# Patient Record
Sex: Female | Born: 1955 | Race: White | Hispanic: No | Marital: Married | Smoking: Current every day smoker
Health system: Southern US, Community
[De-identification: ages and names within clinical notes are randomized; demographics above are authoritative.]

## PROBLEM LIST (undated history)

## (undated) DIAGNOSIS — T7840XA Allergy, unspecified, initial encounter: Secondary | ICD-10-CM

## (undated) DIAGNOSIS — N200 Calculus of kidney: Secondary | ICD-10-CM

## (undated) HISTORY — DX: Allergy, unspecified, initial encounter: T78.40XA

## (undated) HISTORY — PX: TUBAL LIGATION: SHX77

---

## 1997-03-29 ENCOUNTER — Other Ambulatory Visit: Admission: RE | Admit: 1997-03-29 | Discharge: 1997-03-29 | Payer: Self-pay | Admitting: Obstetrics and Gynecology

## 1997-04-27 ENCOUNTER — Ambulatory Visit (HOSPITAL_COMMUNITY): Admission: RE | Admit: 1997-04-27 | Discharge: 1997-04-27 | Payer: Self-pay | Admitting: Obstetrics and Gynecology

## 1998-04-10 ENCOUNTER — Other Ambulatory Visit: Admission: RE | Admit: 1998-04-10 | Discharge: 1998-04-10 | Payer: Self-pay | Admitting: Obstetrics and Gynecology

## 1998-08-09 ENCOUNTER — Other Ambulatory Visit: Admission: RE | Admit: 1998-08-09 | Discharge: 1998-08-09 | Payer: Self-pay | Admitting: Obstetrics and Gynecology

## 1999-01-01 ENCOUNTER — Other Ambulatory Visit: Admission: RE | Admit: 1999-01-01 | Discharge: 1999-01-01 | Payer: Self-pay | Admitting: Obstetrics and Gynecology

## 1999-06-13 ENCOUNTER — Other Ambulatory Visit: Admission: RE | Admit: 1999-06-13 | Discharge: 1999-06-13 | Payer: Self-pay | Admitting: Obstetrics and Gynecology

## 2000-01-22 ENCOUNTER — Other Ambulatory Visit: Admission: RE | Admit: 2000-01-22 | Discharge: 2000-01-22 | Payer: Self-pay | Admitting: Obstetrics and Gynecology

## 2000-09-29 ENCOUNTER — Other Ambulatory Visit: Admission: RE | Admit: 2000-09-29 | Discharge: 2000-09-29 | Payer: Self-pay | Admitting: Obstetrics and Gynecology

## 2002-01-19 ENCOUNTER — Other Ambulatory Visit: Admission: RE | Admit: 2002-01-19 | Discharge: 2002-01-19 | Payer: Self-pay | Admitting: Obstetrics and Gynecology

## 2003-01-26 ENCOUNTER — Other Ambulatory Visit: Admission: RE | Admit: 2003-01-26 | Discharge: 2003-01-26 | Payer: Self-pay | Admitting: Obstetrics and Gynecology

## 2004-01-23 ENCOUNTER — Other Ambulatory Visit: Admission: RE | Admit: 2004-01-23 | Discharge: 2004-01-23 | Payer: Self-pay | Admitting: Obstetrics and Gynecology

## 2004-12-26 ENCOUNTER — Other Ambulatory Visit: Admission: RE | Admit: 2004-12-26 | Discharge: 2004-12-26 | Payer: Self-pay | Admitting: Obstetrics and Gynecology

## 2014-03-15 ENCOUNTER — Encounter (HOSPITAL_COMMUNITY): Payer: Self-pay | Admitting: Emergency Medicine

## 2014-03-15 ENCOUNTER — Emergency Department (HOSPITAL_COMMUNITY)
Admission: EM | Admit: 2014-03-15 | Discharge: 2014-03-15 | Disposition: A | Discharge status: Home / Self Care | Payer: Self-pay | Attending: Emergency Medicine | Admitting: Emergency Medicine

## 2014-03-15 ENCOUNTER — Emergency Department (HOSPITAL_COMMUNITY): Payer: Self-pay

## 2014-03-15 DIAGNOSIS — Z9889 Other specified postprocedural states: Secondary | ICD-10-CM | POA: Insufficient documentation

## 2014-03-15 DIAGNOSIS — N200 Calculus of kidney: Secondary | ICD-10-CM | POA: Insufficient documentation

## 2014-03-15 DIAGNOSIS — R109 Unspecified abdominal pain: Secondary | ICD-10-CM

## 2014-03-15 DIAGNOSIS — Z720 Tobacco use: Secondary | ICD-10-CM | POA: Insufficient documentation

## 2014-03-15 DIAGNOSIS — Z9851 Tubal ligation status: Secondary | ICD-10-CM | POA: Insufficient documentation

## 2014-03-15 HISTORY — DX: Calculus of kidney: N20.0

## 2014-03-15 LAB — CBC WITH DIFFERENTIAL/PLATELET
Basophils Absolute: 0 K/uL (ref 0.0–0.1)
Basophils Relative: 0 % (ref 0–1)
Eosinophils Absolute: 0 K/uL (ref 0.0–0.7)
Eosinophils Relative: 0 % (ref 0–5)
HCT: 43.5 % (ref 36.0–46.0)
Hemoglobin: 14.2 g/dL (ref 12.0–15.0)
Lymphocytes Relative: 11 % — ABNORMAL LOW (ref 12–46)
Lymphs Abs: 2.2 K/uL (ref 0.7–4.0)
MCH: 30 pg (ref 26.0–34.0)
MCHC: 32.6 g/dL (ref 30.0–36.0)
MCV: 91.8 fL (ref 78.0–100.0)
Monocytes Absolute: 1 K/uL (ref 0.1–1.0)
Monocytes Relative: 5 % (ref 3–12)
Neutro Abs: 17 K/uL — ABNORMAL HIGH (ref 1.7–7.7)
Neutrophils Relative %: 84 % — ABNORMAL HIGH (ref 43–77)
Platelets: 338 K/uL (ref 150–400)
RBC: 4.74 MIL/uL (ref 3.87–5.11)
RDW: 13 % (ref 11.5–15.5)
WBC: 20.2 K/uL — ABNORMAL HIGH (ref 4.0–10.5)

## 2014-03-15 LAB — COMPREHENSIVE METABOLIC PANEL
ALT: 16 U/L (ref 0–35)
ANION GAP: 9 (ref 5–15)
AST: 22 U/L (ref 0–37)
Albumin: 4.7 g/dL (ref 3.5–5.2)
Alkaline Phosphatase: 99 U/L (ref 39–117)
BILIRUBIN TOTAL: 0.6 mg/dL (ref 0.3–1.2)
BUN: 16 mg/dL (ref 6–23)
CALCIUM: 9.1 mg/dL (ref 8.4–10.5)
CO2: 24 mmol/L (ref 19–32)
CREATININE: 0.78 mg/dL (ref 0.50–1.10)
Chloride: 105 mmol/L (ref 96–112)
GFR calc Af Amer: >90 mL/min (ref >90)
GFR calc non Af Amer: >90 mL/min (ref >90)
Glucose, Bld: 147 mg/dL — ABNORMAL HIGH (ref 70–99)
Potassium: 3.3 mmol/L — ABNORMAL LOW (ref 3.5–5.1)
SODIUM: 138 mmol/L (ref 135–145)
TOTAL PROTEIN: 7.2 g/dL (ref 6.0–8.3)

## 2014-03-15 LAB — URINALYSIS, ROUTINE W REFLEX MICROSCOPIC
Bilirubin Urine: NEGATIVE
Glucose, UA: 100 mg/dL — AB
Ketones, ur: NEGATIVE mg/dL
Leukocytes, UA: NEGATIVE
Nitrite: NEGATIVE
Protein, ur: NEGATIVE mg/dL
Specific Gravity, Urine: 1.013 (ref 1.005–1.030)
Urobilinogen, UA: 0.2 mg/dL (ref 0.0–1.0)
pH: 7 (ref 5.0–8.0)

## 2014-03-15 LAB — URINE MICROSCOPIC-ADD ON

## 2014-03-15 MED ORDER — IBUPROFEN 600 MG PO TABS: 600.0000 mg | ORAL_TABLET | Freq: Four times a day (QID) | ORAL | Status: DC | PRN

## 2014-03-15 MED ORDER — ONDANSETRON HCL 4 MG/2ML IJ SOLN
4.0000 mg | Freq: Once | INTRAMUSCULAR | Status: AC
  Administered 2014-03-15: 4 mg via INTRAVENOUS
  Filled 2014-03-15: qty 2

## 2014-03-15 MED ORDER — ONDANSETRON 8 MG PO TBDP: 8.0000 mg | ORAL_TABLET | Freq: Three times a day (TID) | ORAL | Status: DC | PRN

## 2014-03-15 MED ORDER — MORPHINE SULFATE 4 MG/ML IJ SOLN
4.0000 mg | Freq: Once | INTRAMUSCULAR | Status: AC
Start: 2014-03-15 — End: 2014-03-15
  Administered 2014-03-15: 4 mg via INTRAVENOUS
  Filled 2014-03-15: qty 1

## 2014-03-15 MED ORDER — HYDROCODONE-ACETAMINOPHEN 5-325 MG PO TABS: 2.0000 | ORAL_TABLET | ORAL | Status: DC | PRN

## 2014-03-15 MED ORDER — KETOROLAC TROMETHAMINE 30 MG/ML IJ SOLN
30.0000 mg | Freq: Once | INTRAMUSCULAR | Status: AC
  Administered 2014-03-15: 30 mg via INTRAVENOUS
  Filled 2014-03-15: qty 1

## 2014-03-15 NOTE — Discharge Instructions (Signed)
You have a 4.2 mm stone that has passed into your bladder from your right kidney.  Drink plenty of fluids.  Take pain and nausea medications as prescribed.  Return to the ER for worsening pain, or nausea despite medications, fever, or other new concerning symptoms.  Follow up with urology as listed above.  Your CT scan  also shows a 5 mm stone in your right kidney that may cause problems in the future.  If you have problems with kidney stones either with your current stone or with future stones, the urology group prefers that you be seen at the [facility] ER versus [facility].  They are better equipped to handle complications at the [facility].     Kidney Stones Kidney stones (urolithiasis) are deposits that form inside your kidneys. The intense pain is caused by the stone moving through the urinary tract. When the stone moves, the ureter goes into spasm around the stone. The stone is usually passed in the urine.  CAUSES   A disorder that makes certain neck glands produce too much parathyroid hormone (primary hyperparathyroidism).  A buildup of uric acid crystals, similar to gout in your joints.  Narrowing (stricture) of the ureter.  A kidney obstruction present at birth (congenital obstruction).  Previous surgery on the kidney or ureters.  Numerous kidney infections. SYMPTOMS   Feeling sick to your stomach (nauseous).  Throwing up (vomiting).  Blood in the urine (hematuria).  Pain that usually spreads (radiates) to the groin.  Frequency or urgency of urination. DIAGNOSIS   Taking a history and physical exam.  Blood or urine tests.  CT scan.  Occasionally, an examination of the inside of the urinary bladder (cystoscopy) is performed. TREATMENT   Observation.  Increasing your fluid intake.  Extracorporeal shock wave lithotripsy--This is a noninvasive procedure that uses shock waves to break up kidney stones.  Surgery may be needed if you have severe pain or  persistent obstruction. There are various surgical procedures. Most of the procedures are performed with the use of small instruments. Only small incisions are needed to accommodate these instruments, so recovery time is minimized. The size, location, and chemical composition are all important variables that will determine the proper choice of action for you. Talk to your health care provider to better understand your situation so that you will minimize the risk of injury to yourself and your kidney.  HOME CARE INSTRUCTIONS   Drink enough water and fluids to keep your urine clear or pale yellow. This will help you to pass the stone or stone fragments.  Strain all urine through the provided strainer. Keep all particulate matter and stones for your health care provider to see. The stone causing the pain may be as small as a grain of salt. It is very important to use the strainer each and every time you pass your urine. The collection of your stone will allow your health care provider to analyze it and verify that a stone has actually passed. The stone analysis will often identify what you can do to reduce the incidence of recurrences.  Only take over-the-counter or prescription medicines for pain, discomfort, or fever as directed by your health care provider.  Make a follow-up appointment with your health care provider as directed.  Get follow-up X-rays if required. The absence of pain does not always mean that the stone has passed. It may have only stopped moving. If the urine remains completely obstructed, it can cause loss of kidney function  or even complete destruction of the kidney. It is your responsibility to make sure X-rays and follow-ups are completed. Ultrasounds of the kidney can show blockages and the status of the kidney. Ultrasounds are not associated with any radiation and can be performed easily in a matter of minutes. SEEK MEDICAL CARE IF:  You experience pain that is progressive and  unresponsive to any pain medicine you have been prescribed. SEEK IMMEDIATE MEDICAL CARE IF:   Pain cannot be controlled with the prescribed medicine.  You have a fever or shaking chills.  The severity or intensity of pain increases over 18 hours and is not relieved by pain medicine.  You develop a new onset of abdominal pain.  You feel faint or pass out.  You are unable to urinate. MAKE SURE YOU:   Understand these instructions.  Will watch your condition.  Will get help right away if you are not doing well or get worse. Document Released: 02/02/2005 Document Revised: 10/05/2012 Document Reviewed: 07/06/2012 [facility] Patient Information 2015 [employer], [place]. This information is not intended to replace advice given to you by your health care provider. Make sure you discuss any questions you have with your health care provider.  Urine Strainer This strainer is used to catch or filter out any stones found in your urine. Place the strainer under your urine stream. Save any stones or objects that you find in your urine. Place them in a plastic or glass container to show your caregiver. The stones vary in size - some can be very small, so make sure you check the strainer carefully. Your caregiver may send the stone to the lab. When the results are back, your caregiver may recommend medicines or diet changes.  Document Released: 11/08/2003 Document Revised: 04/27/2011 Document Reviewed: 12/16/2007 [facility] Patient Information 2015 [place], [place]. This information is not intended to replace advice given to you by your health care provider. Make sure you discuss any questions you have with your health care provider.

## 2014-03-15 NOTE — ED Provider Notes (Signed)
CSN: [id number]     Arrival date & time 03/15/14  0216 History   First MD Initiated Contact with Patient 03/15/14 [phone]     Chief Complaint  Patient presents with  . Flank Pain     (Consider location/radiation/quality/duration/timing/severity/associated sxs/prior Treatment) HPI 59 year old female presents to the emergency department from home with complaint of right lower back pain starting around 11:30 last night.  Pain came on acutely.  It stays in the right lower back and slightly wraps around into her right hip.  She denies any trauma to the area.  Pain is worse with movement.  Better with standing.  Patient has history of kidney stone, but she reports she is unsure if this pain is like her prior kidney stone.  She reports she has had nausea with the pain and loose stools.  Patient reports that she was helping move a twin bed earlier today. Past Medical History  Diagnosis Date  . Kidney stone    Past Surgical History  Procedure Laterality Date  . Cesarean section    . Tubal ligation     No family history on file. History  Substance Use Topics  . Smoking status: Current Every Day Smoker -- 1.00 packs/day    Types: Cigarettes  . Smokeless tobacco: Never Used  . Alcohol Use: No   OB History    No data available     Review of Systems  See History of Present Illness; otherwise all other systems are reviewed and negative   Allergies  Review of patient's allergies indicates no known allergies.  Home Medications   Prior to Admission medications   Not on File   BP 146/63 mmHg  Pulse 84  Temp(Src) 97.5 F (36.4 C) (Oral)  Resp 19  Ht 5\' 1"  (1.549 m)  Wt 126 lb (57.153 kg)  BMI 23.82 kg/m2  SpO2 100%  LMP  Physical Exam  Constitutional: She is oriented to person, place, and time. She appears well-developed and well-nourished. She appears distressed (uncomfortable appearing , standing next to the stretcher).  HENT:  Head: Normocephalic and atraumatic.  Nose: Nose  normal.  Mouth/Throat: Oropharynx is clear and moist.  Eyes: Conjunctivae and EOM are normal. Pupils are equal, round, and reactive to light.  Neck: Normal range of motion. Neck supple. No JVD present. No tracheal deviation present. No thyromegaly present.  Cardiovascular: Normal rate, regular rhythm, normal heart sounds and intact distal pulses.  Exam reveals no gallop and no friction rub.   No murmur heard. Pulmonary/Chest: Effort normal and breath sounds normal. No stridor. No respiratory distress. She has no wheezes. She has no rales. She exhibits no tenderness.  Abdominal: Soft. Bowel sounds are normal. She exhibits no distension and no mass. There is no tenderness. There is no rebound and no guarding.  Musculoskeletal: Normal range of motion. She exhibits no edema. Tenderness: patient has tenderness to right paraspinal lumbar muscles, also has pain over right SI joint.  Lymphadenopathy:    She has no cervical adenopathy.  Neurological: She is alert and oriented to person, place, and time. She displays normal reflexes. She exhibits normal muscle tone. Coordination normal.  Skin: Skin is warm and dry. No rash noted. No erythema. No pallor.  Psychiatric: She has a normal mood and affect. Her behavior is normal. Judgment and thought content normal.  Nursing note and vitals reviewed.   ED Course  Procedures (including critical care time) Labs Review Labs Reviewed  URINALYSIS, ROUTINE W REFLEX MICROSCOPIC  CBC WITH DIFFERENTIAL/PLATELET  COMPREHENSIVE METABOLIC PANEL    Imaging Review No results found.   EKG Interpretation None     Results for orders placed or performed during the hospital encounter of 03/15/14  Urinalysis, Routine w reflex microscopic  Result Value Ref Range   Color, Urine YELLOW YELLOW   APPearance CLOUDY (A) CLEAR   Specific Gravity, Urine 1.013 1.005 - 1.030   pH 7.0 5.0 - 8.0   Glucose, UA 100 (A) NEGATIVE mg/dL   Hgb urine dipstick LARGE (A) NEGATIVE    Bilirubin Urine NEGATIVE NEGATIVE   Ketones, ur NEGATIVE NEGATIVE mg/dL   Protein, ur NEGATIVE NEGATIVE mg/dL   Urobilinogen, UA 0.2 0.0 - 1.0 mg/dL   Nitrite NEGATIVE NEGATIVE   Leukocytes, UA NEGATIVE NEGATIVE  CBC with Differential/Platelet  Result Value Ref Range   WBC 20.2 (H) 4.0 - 10.5 K/uL   RBC 4.74 3.87 - 5.11 MIL/uL   Hemoglobin 14.2 12.0 - 15.0 g/dL   HCT 16.1 09.6 - 04.5 %   MCV 91.8 78.0 - 100.0 fL   MCH 30.0 26.0 - 34.0 pg   MCHC 32.6 30.0 - 36.0 g/dL   RDW 40.9 81.1 - 91.4 %   Platelets 338 150 - 400 K/uL   Neutrophils Relative % 84 (H) 43 - 77 %   Neutro Abs 17.0 (H) 1.7 - 7.7 K/uL   Lymphocytes Relative 11 (L) 12 - 46 %   Lymphs Abs 2.2 0.7 - 4.0 K/uL   Monocytes Relative 5 3 - 12 %   Monocytes Absolute 1.0 0.1 - 1.0 K/uL   Eosinophils Relative 0 0 - 5 %   Eosinophils Absolute 0.0 0.0 - 0.7 K/uL   Basophils Relative 0 0 - 1 %   Basophils Absolute 0.0 0.0 - 0.1 K/uL  Comprehensive metabolic panel  Result Value Ref Range   Sodium 138 135 - 145 mmol/L   Potassium 3.3 (L) 3.5 - 5.1 mmol/L   Chloride 105 96 - 112 mmol/L   CO2 24 19 - 32 mmol/L   Glucose, Bld 147 (H) 70 - 99 mg/dL   BUN 16 6 - 23 mg/dL   Creatinine, Ser 7.82 0.50 - 1.10 mg/dL   Calcium 9.1 8.4 - 95.6 mg/dL   Total Protein 7.2 6.0 - 8.3 g/dL   Albumin 4.7 3.5 - 5.2 g/dL   AST 22 0 - 37 U/L   ALT 16 0 - 35 U/L   Alkaline Phosphatase 99 39 - 117 U/L   Total Bilirubin 0.6 0.3 - 1.2 mg/dL   GFR calc non Af Amer >90 >90 mL/min   GFR calc Af Amer >90 >90 mL/min   Anion gap 9 5 - 15  Urine microscopic-add on  Result Value Ref Range   Squamous Epithelial / LPF RARE RARE   WBC, UA 0-2 <3 WBC/hpf   RBC / HPF TOO NUMEROUS TO COUNT <3 RBC/hpf   Bacteria, UA RARE RARE   Ct Renal Stone Study  03/15/2014   CLINICAL DATA:  Initial evaluation for several our history of acute right flank pain.  EXAM: CT ABDOMEN AND PELVIS WITHOUT CONTRAST  TECHNIQUE: Multidetector CT imaging of the abdomen and pelvis  was performed following the standard protocol without IV contrast.  COMPARISON:  None available.  FINDINGS: The visualized lung bases are clear. No pleural or pericardial effusion.  Liver demonstrates a normal unenhanced appearance. Gallbladder within normal limits. No biliary dilatation. The spleen, adrenal glands, and pancreas demonstrate a normal unenhanced appearance.  Left kidney is unremarkable  without evidence of nephrolithiasis or hydronephrosis.  On the right, there is an obstructive 4 mm stone seen layering within the bladder lumen near the right UVJ (series 2, image 66). There is secondary moderate right hydroureteronephrosis. Additional 5 mm nonobstructive calculus present within the lower pole the right kidney. No other stones seen within the right ureter along the course of the right renal collecting system.  Stomach within normal limits. No evidence for bowel obstruction. Appendix is normal. No acute inflammatory changes seen about the bowels.  Bladder within normal limits. Uterus and ovaries are normal for patient age.  No free air or fluid. No pathologically enlarged intra-abdominal pelvic lymph nodes. Moderate aorto bi-iliac atherosclerotic calcifications present. No intra-abdominal aneurysm.  No adenopathy.  No acute osseous abnormality. No worrisome lytic or blastic osseous lesion.  IMPRESSION: 1. 4 mm obstructive stone layering within the bladder lumen near the right UVJ with secondary moderate right hydroureteronephrosis. This stone has passed out of the right renal collecting system at this point. 2. Additional 5 mm nonobstructive right renal calculus. 3. No other acute intra-abdominal or pelvic process. 4. Moderate aorto bi-iliac atherosclerotic disease.   Electronically Signed   By: Rise Mu M.D.   On: 03/15/2014 04:14     MDM   Final diagnoses:  Acute right flank pain  Kidney stone on right side    59 year old female with acute onset of right lower back pain.  Plan  for pain control, UA labs.  Differential includes muscle strain, kidney stone, lumbar disc herniation, diverticulitis   4:33 AM Patient reports that she is feeling much better.  4.2 stone noted to be in the bladder, with mild right hydronephrosis.  Patient has 5 mm calculus within the lower pole of the right kidney.  Leukocytosis noted, suspect secondary to do marginal is a she after pain, nausea, vomiting and diarrhea.  Plan to give patient urine strainer, pain and nausea medicine as needed, and follow-up with urology.    Olivia Mackie, MD 03/15/14 [phone]

## 2014-03-15 NOTE — ED Notes (Signed)
Per EMS, pt started having rt sided flank pain at 2330 last night. Pt also c/o nausea and diarrhea. Denies vomiting.

## 2014-03-15 NOTE — ED Notes (Signed)
Bed: WA06 Expected date:  Expected time:  Means of arrival:  Comments: EMS 

## 2014-04-06 ENCOUNTER — Other Ambulatory Visit (HOSPITAL_COMMUNITY): Payer: Self-pay | Admitting: Urology

## 2014-04-06 DIAGNOSIS — N200 Calculus of kidney: Secondary | ICD-10-CM

## 2014-04-13 ENCOUNTER — Ambulatory Visit (HOSPITAL_COMMUNITY)
Admission: RE | Admit: 2014-04-13 | Discharge: 2014-04-13 | Disposition: A | Discharge status: Home / Self Care | Payer: Self-pay | Source: Ambulatory Visit | Attending: Urology | Admitting: Urology

## 2014-04-13 ENCOUNTER — Encounter (HOSPITAL_COMMUNITY): Payer: Self-pay

## 2014-04-13 DIAGNOSIS — K5730 Diverticulosis of large intestine without perforation or abscess without bleeding: Secondary | ICD-10-CM | POA: Insufficient documentation

## 2014-04-13 DIAGNOSIS — I7090 Unspecified atherosclerosis: Secondary | ICD-10-CM | POA: Insufficient documentation

## 2014-04-13 DIAGNOSIS — N200 Calculus of kidney: Secondary | ICD-10-CM | POA: Insufficient documentation

## 2014-04-13 DIAGNOSIS — R312 Other microscopic hematuria: Secondary | ICD-10-CM | POA: Insufficient documentation

## 2015-05-30 ENCOUNTER — Encounter (INDEPENDENT_AMBULATORY_CARE_PROVIDER_SITE_OTHER): Payer: Self-pay

## 2015-05-30 ENCOUNTER — Ambulatory Visit: Payer: Self-pay | Admitting: Family Medicine

## 2015-05-30 ENCOUNTER — Encounter: Payer: Self-pay | Admitting: Family Medicine

## 2015-05-30 VITALS — BP 105/56 | HR 85 | Temp 98.1°F | Ht 61.0 in | Wt 129.4 lb

## 2015-05-30 DIAGNOSIS — J302 Other seasonal allergic rhinitis: Secondary | ICD-10-CM

## 2015-05-30 DIAGNOSIS — J208 Acute bronchitis due to other specified organisms: Secondary | ICD-10-CM

## 2015-05-30 DIAGNOSIS — Z720 Tobacco use: Secondary | ICD-10-CM

## 2015-05-30 MED ORDER — PREDNISONE 20 MG PO TABS: 40.0000 mg | ORAL_TABLET | Freq: Every day | ORAL | Status: DC

## 2015-05-30 NOTE — Progress Notes (Signed)
   HPI  Patient presents today here with cough and congestion.  Patient explains that for the last 5-6 weeks she's had her missing bilateral ear pain and irritation, cough, congestion, chest congestion, and wheezing intermittently.  She is a smoker, not really interested in quitting.  She's also reestablishing care here, she's not been seen in over 10 years here.  She denies any other health concerns.  She was diagnosed with the flu about 6 weeks ago and states that all this lingered on afterwards.  She's tolerating food and fluids normally, breathing normally, and has no chest pain. No fevers, chills, sweats, or malaise. She's been going to work like normal  PMH: Smoking status noted Past medical, surgical, social, family history reviewed and updated in EMR ROS: Per HPI  Objective: BP 105/56 mmHg  Pulse 85  Temp(Src) 98.1 F (36.7 C) (Oral)  Ht 5\' 1"  (1.549 m)  Wt 129 lb 6.4 oz (58.695 kg)  BMI 24.46 kg/m2 Gen: NAD, alert, cooperative with exam HEENT: NCAT, TMs normal bilaterally, nares with swollen turbinates bilaterally, oropharynx with cobblestoning CV: RRR, good S1/S2, no murmur Resp: CTABL, no wheezes, non-labored Ext: No edema, warm Neuro: Alert and oriented, No gross deficits  Assessment and plan:  # Post viral bronchitis, allergic rhinitis I think she has post influenza bronchitis, no signs of pneumonia. Prednisone 5 days Start Zyrtec and Nasacort Return to clinic for routine physical She has worsening symptoms, low threshold for antibiotics given recent influenza, smoking, and lingering illness   azithro would be reasonable  Meds ordered this encounter  Medications  . predniSONE (DELTASONE) 20 MG tablet    Sig: Take 2 tablets (40 mg total) by mouth daily with breakfast.    Dispense:  10 tablet    Refill:  0    Murtis SinkSam Bradshaw, MD [facility] 05/30/2015, 3:58 PM

## 2015-05-30 NOTE — Patient Instructions (Addendum)
Great to meet you!  Take the predniosne starting tomorrow  Start nasocort 2 sprays per nostril once daily (give it at least a week before thinking its not helping)  Start a daily plain Zyrtec (store brand is good, not zyrtec D)

## 2015-05-30 NOTE — Addendum Note (Signed)
Addended by: Elenora GammaBRADSHAW, SAMUEL L on: 05/30/2015 04:07 PM   Modules accepted: Level of Service

## 2015-06-12 ENCOUNTER — Telehealth: Payer: Self-pay | Admitting: Family Medicine

## 2015-06-12 ENCOUNTER — Other Ambulatory Visit: Payer: Self-pay | Admitting: Family Medicine

## 2015-06-12 MED ORDER — AZITHROMYCIN 250 MG PO TABS: ORAL_TABLET | ORAL | Status: DC

## 2015-06-12 NOTE — Telephone Encounter (Signed)
Patient with worsening symptoms as discussed I will send her an azithromycin course.  Murtis SinkSam Briton Sellman, MD [facility] 06/12/2015, 4:21 PM

## 2015-06-12 NOTE — Telephone Encounter (Addendum)
Patient aware and verbalizes understanding. 

## 2015-06-12 NOTE — Telephone Encounter (Signed)
Patient states that she is still using the flonase and cetrizine daily and she states that her ears are not improving. Please advise

## 2015-07-08 ENCOUNTER — Encounter: Payer: Self-pay | Admitting: Family Medicine

## 2015-07-08 ENCOUNTER — Ambulatory Visit (INDEPENDENT_AMBULATORY_CARE_PROVIDER_SITE_OTHER): Payer: Self-pay | Admitting: Family Medicine

## 2015-07-08 VITALS — BP 95/55 | HR 81 | Temp 97.1°F | Ht 61.0 in | Wt 128.4 lb

## 2015-07-08 DIAGNOSIS — H6522 Chronic serous otitis media, left ear: Secondary | ICD-10-CM

## 2015-07-08 DIAGNOSIS — H6590 Unspecified nonsuppurative otitis media, unspecified ear: Secondary | ICD-10-CM | POA: Insufficient documentation

## 2015-07-08 MED ORDER — MONTELUKAST SODIUM 10 MG PO TABS: 10.0000 mg | ORAL_TABLET | Freq: Every day | ORAL | Status: DC

## 2015-07-08 NOTE — Progress Notes (Signed)
   HPI  Patient presents today here for follow-up.  Patient states that she continues to have bilateral ear pain, left great slightly worsening right  She describes a crunching sound in her left ear, with certain maneuvers she can get the left ear to feel better and drain. She also has tenderness in the left ear. He's been going on for 2 months after she had influenza. Prednisone helped but then the symptoms returned.  She used azithromycin with no improvement.  Last week she started an old course of doxycycline, for 5 days, which did not help at all.  For the last 3 days she's had nasal congestion and throat clearing. She's cutting back on smoking but still smoking regularly.  She's taking Zyrtec and Flonase over-the-counter. She's also tried Sudafed nasal spray with no improvement.  PMH: Smoking status noted ROS: Per HPI  Objective: BP 95/55 mmHg  Pulse 81  Temp(Src) 97.1 F (36.2 C) (Oral)  Ht 5\' 1"  (1.549 m)  Wt 128 lb 6.4 oz (58.242 kg)  BMI 24.27 kg/m2 Gen: NAD, alert, cooperative with exam HEENT: NCAT, left ear with clear yellow fluid present behind the TM, ossicles still present and easily visualized, right TM normal, bilateral turbinates swollen CV: RRR, good S1/S2, no murmur Resp: CTABL, no wheezes, non-laboredy Ext: No edema, warm Neuro: Alert and oriented, No gross deficits  Assessment and plan:  # Serous otitis media Chronic, discussed eustachian tube dysfunction Continue nasal saline washes twice daily, and Flonase daily Continue Zyrtec daily, consider Allegra or Xyzal Adding Singulair today  # Healthcare maintenance She would like to avoid as much as possible given that she is self-pay Discussed mammograms and colonoscopies, consider FOBT   Meds ordered this encounter  Medications  . fluticasone (FLONASE) 50 MCG/ACT nasal spray    Sig: Place into both nostrils daily.  . cetirizine (ZYRTEC) 10 MG tablet    Sig: Take 10 mg by mouth daily.  .  montelukast (SINGULAIR) 10 MG tablet    Sig: Take 1 tablet (10 mg total) by mouth at bedtime.    Dispense:  30 tablet    Refill:  3    Murtis SinkSam Kimmie Doren, MD [facility] 07/08/2015, 10:11 AM

## 2015-07-08 NOTE — Patient Instructions (Signed)
Great to see you!   Nasal saline washes twice daily Flonase after the night time wash  Zyrtec once daily Singulair once daily (this one is Rx at the pharmacy)  Please call if you develop fever or if the pain becomes severe.

## 2015-07-11 ENCOUNTER — Telehealth: Payer: Self-pay | Admitting: Family Medicine

## 2015-07-11 DIAGNOSIS — H6522 Chronic serous otitis media, left ear: Secondary | ICD-10-CM

## 2015-07-11 NOTE — Telephone Encounter (Signed)
Referral sent to ENT for serous otitis media.  Susan SinkSam [name], MD [facility] 07/11/2015, 5:23 PM

## 2015-07-11 NOTE — Telephone Encounter (Signed)
Patient is requesting a referral to ENT for her ears she states they are not improving.

## 2016-12-06 IMAGING — CT CT ABD-PELV W/O CM
2 of 4 series · 16 of 46 positions shown, 18 images · non-contrast
Comparison: CT of the abdomen and pelvis 03/15/2014.

CLINICAL DATA: Subsequent evaluation of a 59-year-old female with
history of right-sided flank pain and microscopic hematuria.

EXAM:
CT ABDOMEN AND PELVIS WITHOUT CONTRAST
TECHNIQUE: Multidetector CT imaging of the abdomen and pelvis was performed
following the standard protocol without IV contrast.

[Series 2: under 200# stone no prev · axial · 0.58mm/px · z∈[-430,-90]mm · 13 of 74 slices shown, 15 images]
[im 3/74  soft-tissue]
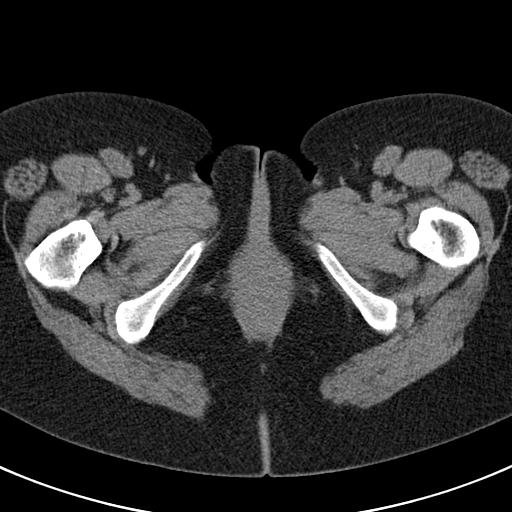
[im 3/74  bone]
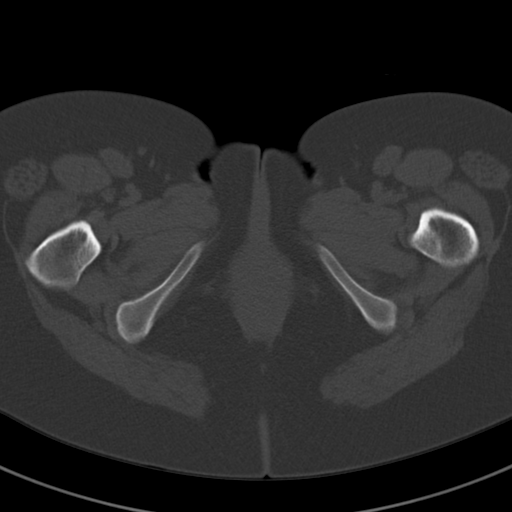
[im 9/74  soft-tissue]
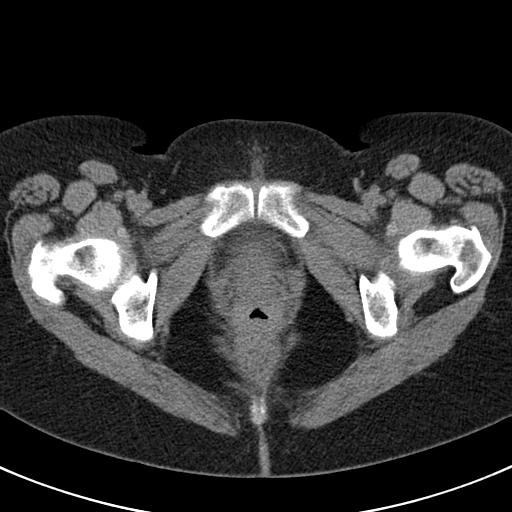
[im 15/74  soft-tissue]
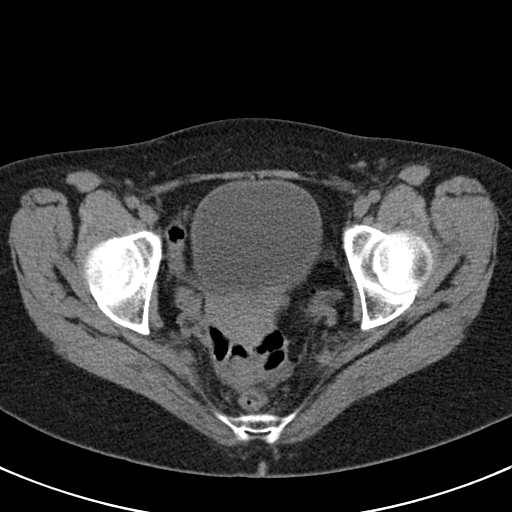
[im 21/74  soft-tissue]
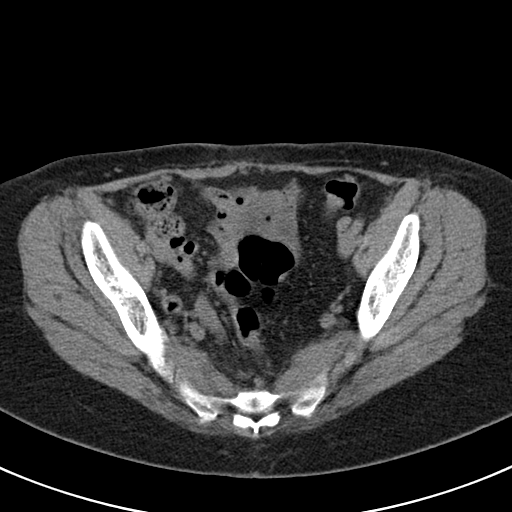
[im 27/74  soft-tissue]
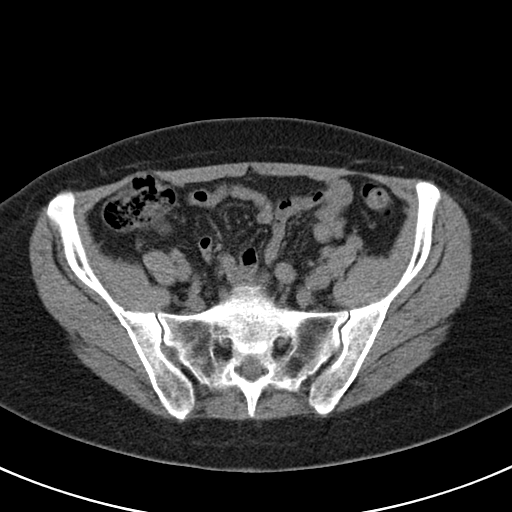
[im 33/74  soft-tissue]
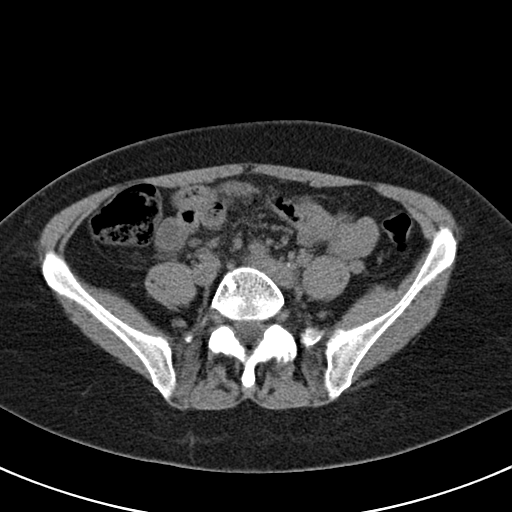
[im 38/74  soft-tissue]
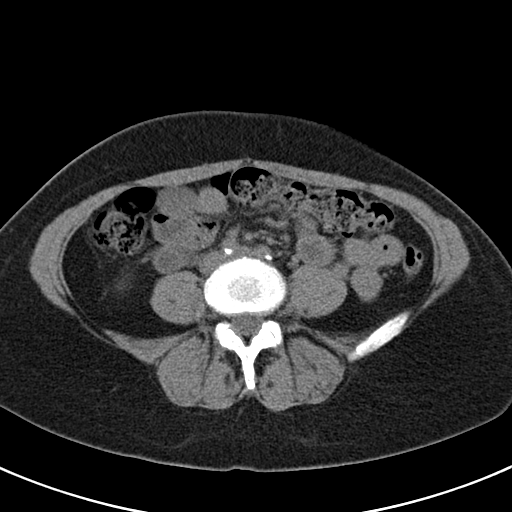
[im 41/74  soft-tissue]
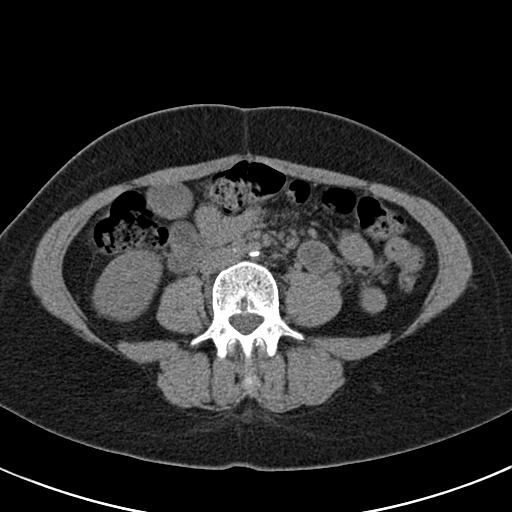
[im 47/74  soft-tissue]
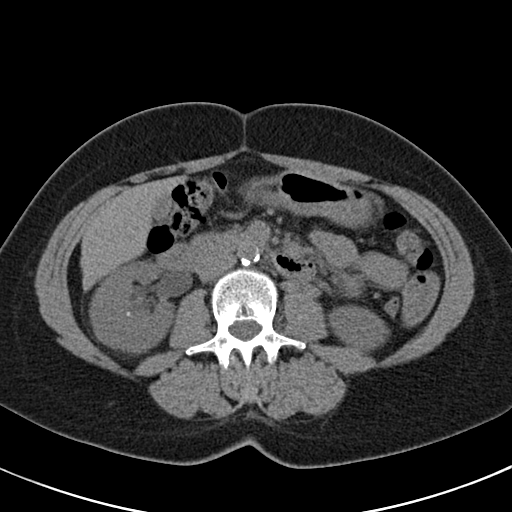
[im 47/74  bone]
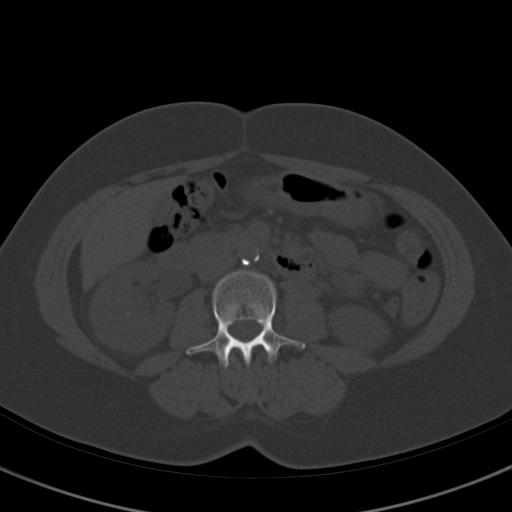
[im 53/74  soft-tissue]
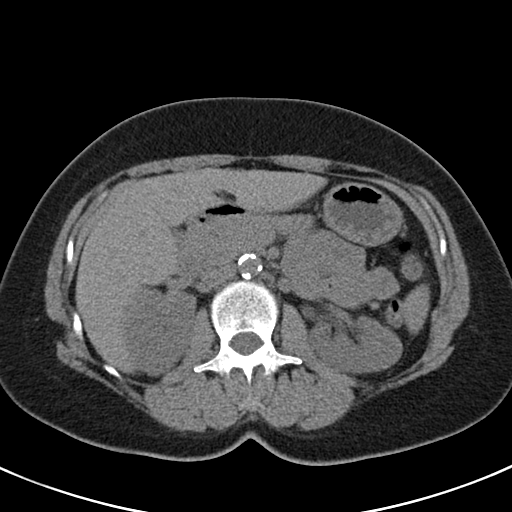
[im 59/74  soft-tissue]
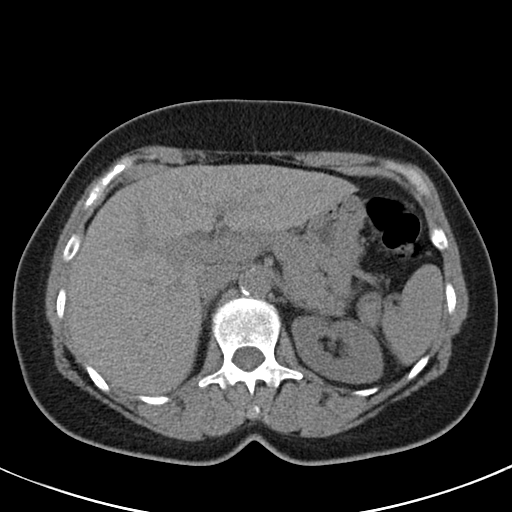
[im 65/74  soft-tissue]
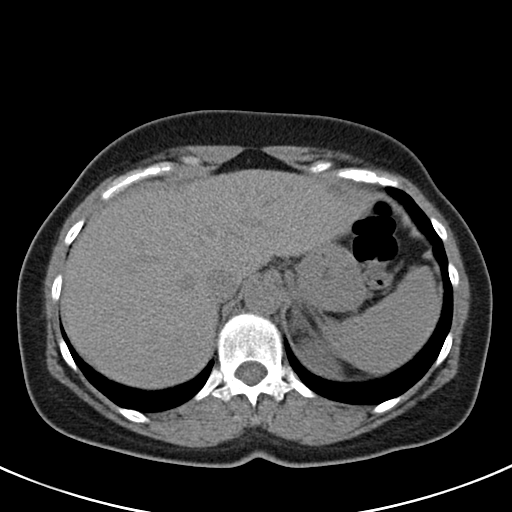
[im 71/74  soft-tissue]
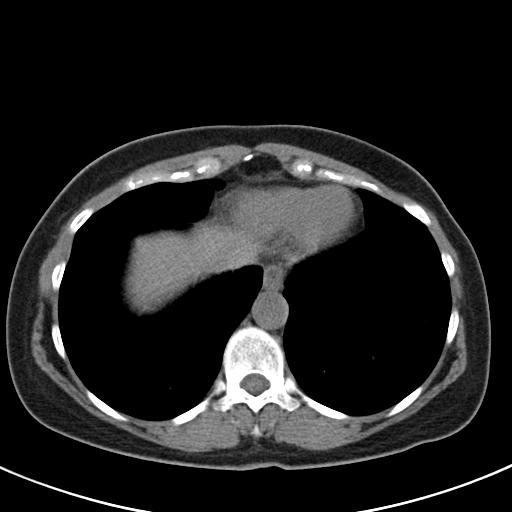

[Series 602: <mpr thick range> · coronal · 0.72mm/px · 3 of 68 slices shown]
[im 23/68  soft-tissue]
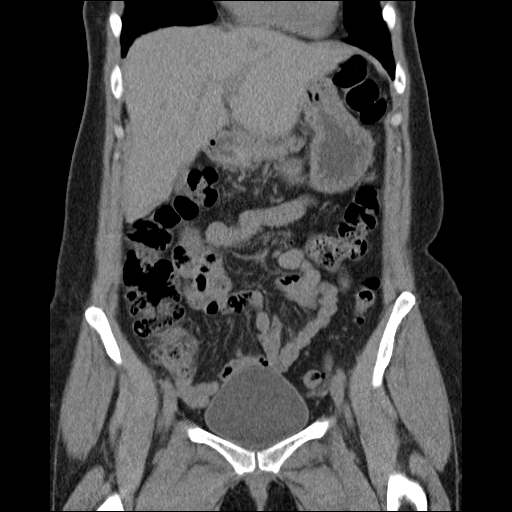
[im 30/68  soft-tissue]
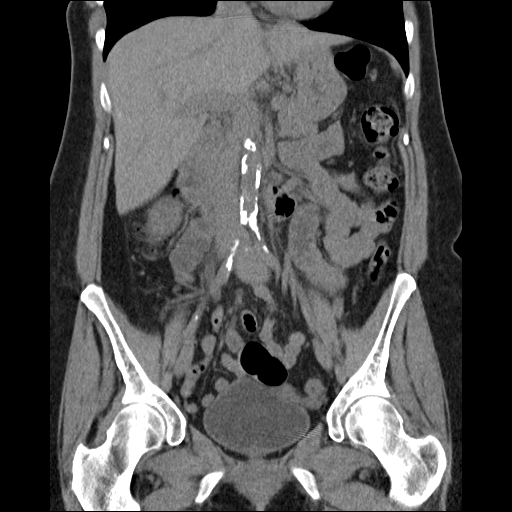
[im 38/68  soft-tissue]
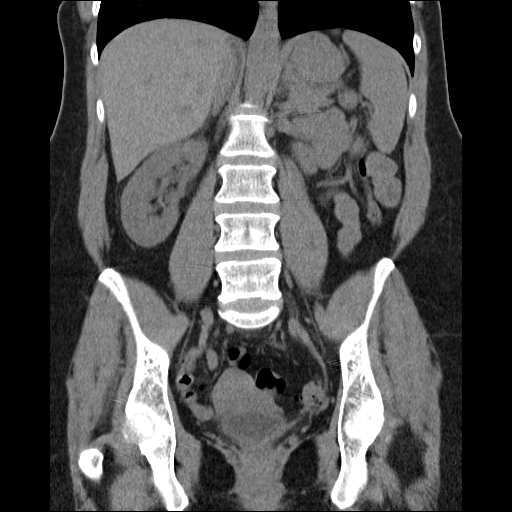

[16 of 46 positions shown; findings below may reference images not displayed]

FINDINGS: Lower chest:  Unremarkable.

Hepatobiliary: No discrete cystic or solid hepatic lesions
identified on today's noncontrast CT examination. Gallbladder is
normal in appearance.

Pancreas: Unremarkable.

Spleen: Unremarkable.

Adrenals/Urinary Tract: Previously noted bladder calculus is no
longer identified, and has presumably passed. There continues to be
a small 3 mm calculus in the interpolar collecting system of the
right kidney. Right-sided hydroureteronephrosis noted on the prior
examination has resolved. Extra renal pelvis associated with the
right renal collecting system (normal anatomical variant)
incidentally noted. Bilateral kidneys are otherwise normal in
appearance. Urinary bladder is normal in appearance on today's non
contrast CT examination. Bilateral adrenal glands are normal.

Stomach/Bowel: Normal unenhanced appearance of the stomach. No
pathologic dilatation of small bowel or colon. A few colonic
diverticulae are noted, particularly in the sigmoid colon, without
surrounding inflammatory changes to suggest an acute diverticulitis
at this time.

Vascular/Lymphatic: Atherosclerosis throughout the abdominal and
pelvic vasculature, without evidence of aneurysm. No definite
lymphadenopathy noted in the abdomen or pelvis on today's non
contrast CT examination.

Reproductive: Uterus and ovaries are atrophic, but otherwise
unremarkable.

Other: No significant volume of ascites.  No pneumoperitoneum.

Musculoskeletal: There are no aggressive appearing lytic or blastic
lesions noted in the visualized portions of the skeleton.
IMPRESSION: 1. Previously noted bladder calculus has passed. There continues to
be a tiny 3 mm nonobstructive calculus in the interpolar collecting
system of the right kidney. Resolution of previously noted
right-sided hydroureteronephrosis.
2. Colonic diverticulosis, without evidence to suggest acute
diverticulitis at this time.
3. Atherosclerosis.
4. Additional incidental findings, as above.

## 2017-11-29 ENCOUNTER — Ambulatory Visit (INDEPENDENT_AMBULATORY_CARE_PROVIDER_SITE_OTHER): Payer: Self-pay | Admitting: Family Medicine

## 2017-11-29 ENCOUNTER — Encounter: Payer: Self-pay | Admitting: Family Medicine

## 2017-11-29 VITALS — BP 126/67 | HR 111 | Temp 97.2°F | Resp 94 | Ht 61.0 in | Wt 130.2 lb

## 2017-11-29 DIAGNOSIS — J069 Acute upper respiratory infection, unspecified: Secondary | ICD-10-CM

## 2017-11-29 MED ORDER — AZITHROMYCIN 250 MG PO TABS: ORAL_TABLET | ORAL | 0 refills | Status: DC

## 2017-11-29 NOTE — Progress Notes (Signed)
Subjective: CC: Sinusitis PCP: Raliegh Ip, DO Susan Baird is a 62 y.o. female presenting to clinic today for:  1. Sinusitis Patient reports onset of symptoms Friday evening.  She describes pressure behind her eyes and nose.  She reports intermittent headache and dental pain/ear pain.  She reports decreased energy.  No measured fevers.  She has had a productive cough with green phlegm.  No shortness of breath or hemoptysis.  She has been using Sudafed and sinus rinses along with her Flonase and Zyrtec with little improvement in symptoms.  She is a every day smoker and exposed to secondhand smoke.   ROS: Per HPI  No Known Allergies Past Medical History:  Diagnosis Date  . Kidney stone     Current Outpatient Medications:  .  cetirizine (ZYRTEC) 10 MG tablet, Take 10 mg by mouth daily., Disp: , Rfl:  .  fluticasone (FLONASE) 50 MCG/ACT nasal spray, Place into both nostrils daily., Disp: , Rfl:  .  montelukast (SINGULAIR) 10 MG tablet, Take 1 tablet (10 mg total) by mouth at bedtime., Disp: 30 tablet, Rfl: 3 Social History   Socioeconomic History  . Marital status: Married    Spouse name: Not on file  . Number of children: Not on file  . Years of education: Not on file  . Highest education level: Not on file  Occupational History  . Not on file  Social Needs  . Financial resource strain: Not on file  . Food insecurity:    Worry: Not on file    Inability: Not on file  . Transportation needs:    Medical: Not on file    Non-medical: Not on file  Tobacco Use  . Smoking status: Current Every Day Smoker    Packs/day: 1.00    Types: Cigarettes  . Smokeless tobacco: Never Used  Substance and Sexual Activity  . Alcohol use: No  . Drug use: No  . Sexual activity: Not on file  Lifestyle  . Physical activity:    Days per week: Not on file    Minutes per session: Not on file  . Stress: Not on file  Relationships  . Social connections:    Talks on phone: Not on  file    Gets together: Not on file    Attends religious service: Not on file    Active member of club or organization: Not on file    Attends meetings of clubs or organizations: Not on file    Relationship status: Not on file  . Intimate partner violence:    Fear of current or ex partner: Not on file    Emotionally abused: Not on file    Physically abused: Not on file    Forced sexual activity: Not on file  Other Topics Concern  . Not on file  Social History Narrative  . Not on file   History reviewed. No pertinent family history.  Objective: Office vital signs reviewed. BP 126/67   Pulse (!) 111   Temp (!) 97.2 F (36.2 C) (Oral)   Resp (!) 94   Ht 5\' 1"  (1.549 m)   Wt 130 lb 3.2 oz (59.1 kg)   BMI 24.60 kg/m   Physical Examination:  General: Awake, alert, nontoxic. No acute distress HEENT: +TTP to maxillary sinuses    Neck: No masses palpated. No lymphadenopathy    Ears: Tympanic membranes intact, normal light reflex, no erythema, mild bulging of left TM    Eyes: PERRLA, extraocular membranes intact,  sclera white    Nose: nasal turbinates moist, clear nasal discharge    Throat: moist mucus membranes, no erythema, tonsils surgically absent.  Airway is patent Cardio: regular rate and rhythm, S1S2 heard, no murmurs appreciated Pulm: clear to auscultation bilaterally, no wheezes, rhonchi or rales; normal work of breathing on room air  Assessment/ Plan: 62 y.o. female   1. URI with cough and congestion Patient is afebrile nontoxic-appearing.  Physical exam remarkable for mild bulging of her left tympanic membrane.  I have given her a written prescription for Z-Pak and we discussed instructions for use.  For now, I would like her to hold off on the antibiotic because I do think this is likely viral.  She voiced good understanding and will fill it only if needed.  Reasons for return discussed.  Handout provided.  She will follow-up PRN.   Meds ordered this encounter    Medications  . azithromycin (ZITHROMAX Z-PAK) 250 MG tablet    Sig: As directed    Dispense:  6 tablet    Refill:  0     Susan Baird Hulen Skains, DO [facility] [phone]

## 2017-11-29 NOTE — Patient Instructions (Signed)
I think that this is viral and it will get better on its own over the next couple of days.  However, I have given you a written prescription to have in case things get worse or do not improve as we expect.  Continue with the regimen that you are using at home for allergies and sinus rinses.  It appears that you have a viral upper respiratory infection (cold).  Cold symptoms can last up to 2 weeks.    - Get plenty of rest and drink plenty of fluids. - Try to breathe moist air. Use a cold mist humidifier. - Consume warm fluids (soup or tea) to provide relief for a stuffy nose and to loosen phlegm. - For nasal stuffiness, try saline nasal spray or a Neti Pot. Afrin nasal spray can also be used but this product should not be used longer than 3 days or it will cause rebound nasal stuffiness (worsening nasal congestion). - For sore throat pain relief: use chloraseptic spray, suck on throat lozenges, hard candy or popsicles; gargle with warm salt water (1/4 tsp. salt per 8 oz. of water); and eat soft, bland foods. - Eat a well-balanced diet. If you cannot, ensure you are getting enough nutrients by taking a daily multivitamin. - Avoid dairy products, as they can thicken phlegm. - Avoid alcohol, as it impairs your body's immune system.  CONTACT YOUR DOCTOR IF YOU EXPERIENCE ANY OF THE FOLLOWING: - High fever - Ear pain - Sinus-type headache - Unusually severe cold symptoms - Cough that gets worse while other cold symptoms improve - Flare up of any chronic lung problem, such as asthma - Your symptoms persist longer than 2 weeks

## 2019-02-22 ENCOUNTER — Other Ambulatory Visit: Payer: Self-pay

## 2019-02-22 ENCOUNTER — Ambulatory Visit (INDEPENDENT_AMBULATORY_CARE_PROVIDER_SITE_OTHER): Payer: Self-pay | Admitting: Family Medicine

## 2019-02-22 VITALS — Temp 96.5°F

## 2019-02-22 DIAGNOSIS — R1011 Right upper quadrant pain: Secondary | ICD-10-CM

## 2019-02-22 MED ORDER — OMEPRAZOLE 20 MG PO CPDR: 20.0000 mg | DELAYED_RELEASE_CAPSULE | Freq: Every day | ORAL | 3 refills | Status: DC

## 2019-02-22 MED ORDER — PANTOPRAZOLE SODIUM 40 MG PO TBEC: 40.0000 mg | DELAYED_RELEASE_TABLET | Freq: Every day | ORAL | 0 refills | Status: DC

## 2019-02-22 NOTE — Progress Notes (Signed)
Telephone visit  Subjective: CC: abdominal pain PCP: Raliegh Ip, DO IRC:VELFYB Susan Baird is a 64 y.o. female calls for telephone consult today. Patient provides verbal consent for consult held via phone.  Due to COVID-19 pandemic this visit was conducted virtually. This visit type was conducted due to national recommendations for restrictions regarding the COVID-19 Pandemic (e.g. social distancing, sheltering in place) in an effort to limit this patient's exposure and mitigate transmission in our community. All issues noted in this document were discussed and addressed.  A physical exam was not performed with this format.   Location of patient: home Location of provider: [facility] Others present for call: none  1. Abdominal pain She reports it started last Tuesday.  She reports severe bloating and pain along the right side of her stomach.  She reports small bowel movements.  She reports having had a very fatty supper.  She notes decreased PO intake on Wednesday due to bloating and pain.  She had a low grade fever to 99.70F.  She used pepto-bismol.  Pain seems to migrate around her abdomen.  She at [facility] ribs on Friday and RUQ pain returned.  She reports stools are brown and loose/soft (but this is normal for her).  She started taking align.  Pain is improved with moving around.  She is having mild pain in her upper mid back today.  Denies vomiting but has had intermittent nausea.  She is tolerating fluids.  ROS: Per HPI  No Known Allergies Past Medical History:  Diagnosis Date  . Kidney stone     Current Outpatient Medications:  .  cetirizine (ZYRTEC) 10 MG tablet, Take 10 mg by mouth daily., Disp: , Rfl:  .  fluticasone (FLONASE) 50 MCG/ACT nasal spray, Place into both nostrils daily., Disp: , Rfl:   Assessment/ Plan: 64 y.o. female   1. RUQ pain Highly suspicious for cholecystitis.  I do not think she has an overt infection at this point but we did discuss avoidance of fatty foods so  as to reduce symptoms.  I am ordering a stat ultrasound and have sent in a PPI to see if this might help some with symptoms.  I placed a handout upfront for her to collect.  We discussed red flag signs and symptoms warranting further evaluation emergency department.  I will arrange surgical evaluation pending the ultrasound results. - US Abdomen Limited RUQ; Future   Start time: 4:25pm End time: 4:48pm  Total time spent on patient care (including telephone call/ virtual visit): 30 minutes   Hulen Skains, DO [facility] [phone]

## 2019-02-22 NOTE — Patient Instructions (Addendum)
Cholecystitis  Cholecystitis is inflammation of the gallbladder. It is often called a gallbladder attack. The gallbladder is a pear-shaped organ that lies beneath the liver on the right side of the body. The gallbladder stores bile, which is a fluid that helps the body digest fats. If bile builds up in your gallbladder, your gallbladder becomes inflamed. This condition may occur suddenly. Cholecystitis is a serious condition and requires treatment. What are the causes? The most common cause of this condition is gallstones. Gallstones can block the tube (duct) that carries bile out of your gallbladder. This causes bile to build up. Other causes include:  Damage to the gallbladder due to a decrease in blood flow.  Infections in the bile ducts.  Scars or kinks in the bile ducts.  Tumors in the liver, pancreas, or gallbladder. What increases the risk? You are more likely to develop this condition if:  You have sickle cell disease.  You take birth control pills or use estrogen.  You have alcoholic liver disease.  You have liver cirrhosis.  You have your nutrition delivered through a vein (parenteral nutrition).  You are critically ill.  You do not eat or drink for a long time. This is also called "fasting."  You are obese.  You lose weight too fast.  You are pregnant.  You have high levels of fat (triglycerides) in the blood.  You have pancreatitis. What are the signs or symptoms? Symptoms of this condition include:  Pain in the abdomen, especially in the upper right area of the abdomen.  Tenderness or bloating in the abdomen.  Nausea.  Vomiting.  Fever.  Chills. How is this diagnosed? This condition is diagnosed with a medical history and physical exam. You may also have other tests, including:  Imaging tests, such as: ? An ultrasound of the gallbladder. ? A CT scan of the abdomen. ? A gallbladder nuclear scan (HIDA scan). This scan allows your health care  provider to see the bile moving from your liver to your gallbladder and on to your small intestine. ? MRI.  Blood tests, such as: ? A complete blood count. The white blood cell count may be higher than normal. ? Liver function tests. Certain types of gallstones cause some results to be higher than normal. How is this treated? Treatment may include:  Surgery to remove your gallbladder (cholecystectomy).  Antibiotic medicine, usually through an IV.  Fasting for a certain amount of time.  Giving IV fluids.  Medicine to treat pain or vomiting. Follow these instructions at home:  If you had surgery, follow instructions from your health care provider about home care after the procedure. Medicines   Take over-the-counter and prescription medicines only as told by your health care provider.  If you were prescribed an antibiotic medicine, take it as told by your health care provider. Do not stop taking the antibiotic even if you start to feel better. General instructions  Follow instructions from your health care provider about what to eat or drink. When you are allowed to eat, avoid eating or drinking anything that triggers your symptoms.  Do not lift anything that is heavier than 10 lb (4.5 kg), or the limit that you are told, until your health care provider says that it is safe.  Do not use any products that contain nicotine or tobacco, such as cigarettes and e-cigarettes. If you need help quitting, ask your health care provider.  Keep all follow-up visits as told by your health care provider. This is   important. Contact a health care provider if:  Your pain is not controlled with medicine.  You have a fever. Get help right away if:  Your pain moves to another part of your abdomen or to your back.  You continue to have symptoms or you develop new symptoms even with treatment. Summary  Cholecystitis is inflammation of the gallbladder.  The most common cause of this condition  is gallstones. Gallstones can block the tube (duct) that carries bile out of your gallbladder.  Common symptoms are pain in the abdomen, nausea, vomiting, fever, and chills.  This condition is treated with surgery to remove the gallbladder, medicines, fasting, and IV fluids.  Follow your health care provider's instructions for eating and drinking. Avoid eating anything that triggers your symptoms. This information is not intended to replace advice given to you by your health care provider. Make sure you discuss any questions you have with your health care provider. Document Revised: 06/11/2017 Document Reviewed: 06/11/2017 Elsevier Patient Education  2020 [employer]  

## 2019-02-24 ENCOUNTER — Ambulatory Visit (HOSPITAL_COMMUNITY)
Admission: RE | Admit: 2019-02-24 | Discharge: 2019-02-24 | Disposition: A | Discharge status: Home / Self Care | Payer: Self-pay | Source: Ambulatory Visit | Attending: Family Medicine | Admitting: Family Medicine

## 2019-02-24 ENCOUNTER — Other Ambulatory Visit: Payer: Self-pay

## 2019-02-24 ENCOUNTER — Telehealth: Payer: Self-pay

## 2019-02-24 DIAGNOSIS — R1011 Right upper quadrant pain: Secondary | ICD-10-CM | POA: Insufficient documentation

## 2019-02-24 NOTE — Telephone Encounter (Signed)
Ultrasound dept called with STAT report:  IMPRESSION: 1. 3 mm apparent polyp in the gallbladder. Per consensus guidelines, a polyp of this size does not warrant additional imaging surveillance. No gallstones, gallbladder wall thickening, or pericholecystic fluid.  2.  Study otherwise unremarkable.   Patient was sent home. Please review and advise.

## 2019-02-26 NOTE — Telephone Encounter (Signed)
No acute problems. PCP will review when she returns.

## 2019-02-28 ENCOUNTER — Other Ambulatory Visit: Payer: Self-pay | Admitting: Family Medicine

## 2019-02-28 ENCOUNTER — Telehealth: Payer: Self-pay | Admitting: Family Medicine

## 2019-02-28 DIAGNOSIS — R1011 Right upper quadrant pain: Secondary | ICD-10-CM

## 2019-02-28 NOTE — Telephone Encounter (Signed)
REFERRAL REQUEST Telephone Note  What type of referral do you need? Gallbladder removal  Have you been seen at our office for this problem? yes (Advise that they will likely need an appointment with their PCP before a referral can be done)  Is there a particular doctor or location that you prefer? Patient does not have anyone in mind. Patient wants someone in [place].  Patient notified that referrals can take up to a week or longer to process. If they haven't heard anything within a week they should call back and speak with the referral department.

## 2019-02-28 NOTE — Telephone Encounter (Signed)
Referral placed to general surgery.  Please note that they may require additional information before proceeding with any surgical intervention.  However, will defer to their evaluation to determine if this is needed.

## 2019-03-07 ENCOUNTER — Ambulatory Visit: Payer: Self-pay | Admitting: Surgery

## 2019-03-07 NOTE — H&P (View-Only) (Signed)
History of Present Illness (Quida Glasser K. Ailani Governale MD; 03/07/2019 5:39 PM) The patient is a 64 year old female who presents with abdominal pain. Referred by Dr. Ashley Gottschalk for possible GB disease  This is a 64-year-old female in good health who presents with recent abdominal symptoms. On 02/14/19 patient had a severe attack of postprandial right upper quadrant abdominal pain that radiated around to her back and up into her shoulder. This was associated with severe nausea, diarrhea, and abdominal distention. This lasted for some time before spontaneously resolved. She has had some intermittent symptoms since that time. She has tried to maintain a low-fat diet. However she is concerned that she could have another attack at any time. She underwent an ultrasound that showed a possible 3 mm gallbladder polyp but no sign of stones or wall thickening.  The patient does not have health insurance and is concerned about the expense of further workup as well as surgery.  CLINICAL DATA: Upper abdominal pain  EXAM: ULTRASOUND ABDOMEN LIMITED RIGHT UPPER QUADRANT  COMPARISON: CT abdomen and pelvis April 13, 2014  FINDINGS: Gallbladder:  Within the gallbladder, there is a 3 mm echogenic focus which neither moves nor shadows, an apparent polyp. There are no echogenic foci in the gallbladder which move and shadow as is expected with gallstones. No gallbladder wall thickening or pericholecystic fluid. No sonographic Murphy sign noted by sonographer.  Common bile duct:  Diameter: 6 mm. No intrahepatic or extrahepatic biliary duct dilatation.  Liver:  No focal lesion identified. Within normal limits in parenchymal echogenicity. Portal vein is patent on color Doppler imaging with normal direction of blood flow towards the liver.  Other: None.  IMPRESSION: 1. 3 mm apparent polyp in the gallbladder. Per consensus guidelines, a polyp of this size does not warrant additional  imaging surveillance. No gallstones, gallbladder wall thickening, or pericholecystic fluid.  2. Study otherwise unremarkable.   Electronically Signed By: William Woodruff III M.D. On: 02/24/2019 09:23   Problem List/Past Medical (Orion Vandervort K. Seanpaul Preece, MD; 03/07/2019 5:39 PM) CHRONIC CHOLECYSTITIS (K81.1)  Allergies (Kelsey Phillips, CMA; 03/07/2019 2:30 PM) No Known Drug Allergies [03/07/2019]: Allergies Reconciled  Medication History (Kelsey Phillips, CMA; 03/07/2019 2:30 PM) No Current Medications Medications Reconciled    Vitals (Kelsey Phillips CMA; 03/07/2019 2:30 PM) 03/07/2019 2:29 PM Weight: 127.2 lb Height: 60in Body Surface Area: 1.54 m Body Mass Index: 24.84 kg/m  Temp.: 98.1F  Pulse: 123 (Regular)  BP: 118/72 (Sitting, Left Arm, Standard)        Physical Exam (Tydarius Yawn K. Xian Alves MD; 03/07/2019 5:40 PM)  The physical exam findings are as follows: Note:WDWN in NAD Eyes: Pupils equal, round; sclera anicteric HENT: Oral mucosa moist; good dentition Neck: No masses palpated, no thyromegaly Lungs: CTA bilaterally; normal respiratory effort CV: Regular rate and rhythm; no murmurs; extremities well-perfused with no edema Abd: +bowel sounds, soft, mildly tender in RUQ, no palpable masses, no palpable organomegaly; no palpable hernias Skin: Warm, dry; no sign of jaundice Psychiatric - alert and oriented x 4; calm mood and affect    Assessment & Plan (Revel Stellmach K. Jerel Sardina MD; 03/07/2019 5:41 PM)  CHRONIC CHOLECYSTITIS (K81.1)  Current Plans Schedule for Surgery - Laparoscopic cholecystectomy with intraoperative cholangiogram. The surgical procedure has been discussed with the patient. Potential risks, benefits, alternative treatments, and expected outcomes have been explained. All of the patient's questions at this time have been answered. The likelihood of reaching the patient's treatment goal is good. The patient understand the proposed  surgical procedure and wishes   to proceed. Note:Her symptoms seem to be classic for gallbladder disease despite ultrasound findings. I spent some time discussing the situation with the patient. She has no sign of acute cholecystitis. A HIDA scan could possibly shows a decreased gallbladder ejection fraction but even if the ejection fraction was normal, she still has classic symptoms for gallbladder disease. She would prefer to proceed with scheduling surgery. I am in agreement with this plan.  Wilmon Arms. Corliss Skains, MD, [facility]  General/ Trauma Surgery   03/07/2019 5:41 PM

## 2019-03-07 NOTE — H&P (Signed)
History of Present Illness Imogene Burn. Ezrael Sam MD; 03/07/2019 5:39 PM) The patient is a 64 year old female who presents with abdominal pain. Referred by Dr. Adam Phenix for possible GB disease  This is a 64 year old female in good health who presents with recent abdominal symptoms. On 02/14/19 patient had a severe attack of postprandial right upper quadrant abdominal pain that radiated around to her back and up into her shoulder. This was associated with severe nausea, diarrhea, and abdominal distention. This lasted for some time before spontaneously resolved. She has had some intermittent symptoms since that time. She has tried to maintain a low-fat diet. However she is concerned that she could have another attack at any time. She underwent an ultrasound that showed a possible 3 mm gallbladder polyp but no sign of stones or wall thickening.  The patient does not have health insurance and is concerned about the expense of further workup as well as surgery.  CLINICAL DATA: Upper abdominal pain  EXAM: ULTRASOUND ABDOMEN LIMITED RIGHT UPPER QUADRANT  COMPARISON: CT abdomen and pelvis April 13, 2014  FINDINGS: Gallbladder:  Within the gallbladder, there is a 3 mm echogenic focus which neither moves nor shadows, an apparent polyp. There are no echogenic foci in the gallbladder which move and shadow as is expected with gallstones. No gallbladder wall thickening or pericholecystic fluid. No sonographic Murphy sign noted by sonographer.  Common bile duct:  Diameter: 6 mm. No intrahepatic or extrahepatic biliary duct dilatation.  Liver:  No focal lesion identified. Within normal limits in parenchymal echogenicity. Portal vein is patent on color Doppler imaging with normal direction of blood flow towards the liver.  Other: None.  IMPRESSION: 1. 3 mm apparent polyp in the gallbladder. Per consensus guidelines, a polyp of this size does not warrant additional  imaging surveillance. No gallstones, gallbladder wall thickening, or pericholecystic fluid.  2. Study otherwise unremarkable.   Electronically Signed By: Lowella Grip III M.D. On: 02/24/2019 09:23   Problem List/Past Medical Rodman Key K. Deloyce Walthers, MD; 03/07/2019 5:39 PM) CHRONIC CHOLECYSTITIS (K81.1)  Allergies Emeline Gins, [place]; 03/07/2019 2:30 PM) No Known Drug Allergies [03/07/2019]: Allergies Reconciled  Medication History Emeline Gins, [place]; 03/07/2019 2:30 PM) No Current Medications Medications Reconciled    Vitals Emeline Gins CMA; 03/07/2019 2:30 PM) 03/07/2019 2:29 PM Weight: 127.2 lb Height: 60in Body Surface Area: 1.54 m Body Mass Index: 24.84 kg/m  Temp.: 98.29F  Pulse: 123 (Regular)  BP: 118/72 (Sitting, Left Arm, Standard)        Physical Exam Rodman Key K. Jamine Highfill MD; 03/07/2019 5:40 PM)  The physical exam findings are as follows: Note:WDWN in NAD Eyes: Pupils equal, round; sclera anicteric HENT: Oral mucosa moist; good dentition Neck: No masses palpated, no thyromegaly Lungs: CTA bilaterally; normal respiratory effort CV: Regular rate and rhythm; no murmurs; extremities well-perfused with no edema Abd: +bowel sounds, soft, mildly tender in RUQ, no palpable masses, no palpable organomegaly; no palpable hernias Skin: Warm, dry; no sign of jaundice Psychiatric - alert and oriented x 4; calm mood and affect    Assessment & Plan Rodman Key K. Cecile Gillispie MD; 03/07/2019 5:41 PM)  CHRONIC CHOLECYSTITIS (K81.1)  Current Plans Schedule for Surgery - Laparoscopic cholecystectomy with intraoperative cholangiogram. The surgical procedure has been discussed with the patient. Potential risks, benefits, alternative treatments, and expected outcomes have been explained. All of the patient's questions at this time have been answered. The likelihood of reaching the patient's treatment goal is good. The patient understand the proposed  surgical procedure and wishes  to proceed. Note:Her symptoms seem to be classic for gallbladder disease despite ultrasound findings. I spent some time discussing the situation with the patient. She has no sign of acute cholecystitis. A HIDA scan could possibly shows a decreased gallbladder ejection fraction but even if the ejection fraction was normal, she still has classic symptoms for gallbladder disease. She would prefer to proceed with scheduling surgery. I am in agreement with this plan.  Wilmon Arms. Corliss Skains, MD, [facility]  General/ Trauma Surgery   03/07/2019 5:41 PM

## 2019-03-28 ENCOUNTER — Encounter (HOSPITAL_BASED_OUTPATIENT_CLINIC_OR_DEPARTMENT_OTHER): Payer: Self-pay | Admitting: Surgery

## 2019-03-28 ENCOUNTER — Other Ambulatory Visit: Payer: Self-pay

## 2019-03-31 NOTE — Progress Notes (Signed)

## 2019-04-01 ENCOUNTER — Other Ambulatory Visit (HOSPITAL_COMMUNITY)
Admission: RE | Admit: 2019-04-01 | Discharge: 2019-04-01 | Disposition: A | Discharge status: Home / Self Care | Payer: HRSA Program | Source: Ambulatory Visit | Attending: Surgery | Admitting: Surgery

## 2019-04-01 DIAGNOSIS — Z20822 Contact with and (suspected) exposure to covid-19: Secondary | ICD-10-CM | POA: Insufficient documentation

## 2019-04-01 DIAGNOSIS — Z01812 Encounter for preprocedural laboratory examination: Secondary | ICD-10-CM | POA: Insufficient documentation

## 2019-04-01 LAB — SARS CORONAVIRUS 2 (TAT 6-24 HRS): SARS Coronavirus 2: NEGATIVE

## 2019-04-05 ENCOUNTER — Encounter (HOSPITAL_BASED_OUTPATIENT_CLINIC_OR_DEPARTMENT_OTHER): Payer: Self-pay | Admitting: Surgery

## 2019-04-05 ENCOUNTER — Ambulatory Visit (HOSPITAL_BASED_OUTPATIENT_CLINIC_OR_DEPARTMENT_OTHER): Payer: Self-pay | Admitting: Anesthesiology

## 2019-04-05 ENCOUNTER — Encounter (HOSPITAL_BASED_OUTPATIENT_CLINIC_OR_DEPARTMENT_OTHER)
Admission: RE | Disposition: A | Discharge status: Home / Self Care | Payer: Self-pay | Source: Home / Self Care | Attending: Surgery

## 2019-04-05 ENCOUNTER — Ambulatory Visit (HOSPITAL_BASED_OUTPATIENT_CLINIC_OR_DEPARTMENT_OTHER)
Admission: RE | Admit: 2019-04-05 | Discharge: 2019-04-05 | Disposition: A | Discharge status: Home / Self Care | Payer: Self-pay | Attending: Surgery | Admitting: Surgery

## 2019-04-05 ENCOUNTER — Other Ambulatory Visit: Payer: Self-pay

## 2019-04-05 ENCOUNTER — Ambulatory Visit (HOSPITAL_COMMUNITY): Payer: Self-pay

## 2019-04-05 DIAGNOSIS — K811 Chronic cholecystitis: Secondary | ICD-10-CM | POA: Insufficient documentation

## 2019-04-05 DIAGNOSIS — F17210 Nicotine dependence, cigarettes, uncomplicated: Secondary | ICD-10-CM | POA: Insufficient documentation

## 2019-04-05 DIAGNOSIS — Z79899 Other long term (current) drug therapy: Secondary | ICD-10-CM | POA: Insufficient documentation

## 2019-04-05 DIAGNOSIS — Z419 Encounter for procedure for purposes other than remedying health state, unspecified: Secondary | ICD-10-CM

## 2019-04-05 DIAGNOSIS — K219 Gastro-esophageal reflux disease without esophagitis: Secondary | ICD-10-CM | POA: Insufficient documentation

## 2019-04-05 HISTORY — DX: Allergy, unspecified, initial encounter: T78.40XA

## 2019-04-05 HISTORY — PX: CHOLECYSTECTOMY: SHX55

## 2019-04-05 SURGERY — LAPAROSCOPIC CHOLECYSTECTOMY WITH INTRAOPERATIVE CHOLANGIOGRAM
Anesthesia: General | Site: Abdomen

## 2019-04-05 MED ORDER — ACETAMINOPHEN 500 MG PO TABS
1000.0000 mg | ORAL_TABLET | ORAL | Status: AC
  Administered 2019-04-05: 09:00:00 1000 mg via ORAL

## 2019-04-05 MED ORDER — LIDOCAINE 2% (20 MG/ML) 5 ML SYRINGE
INTRAMUSCULAR | Status: AC
  Filled 2019-04-05: qty 5

## 2019-04-05 MED ORDER — SUGAMMADEX SODIUM 200 MG/2ML IV SOLN
INTRAVENOUS | Status: DC | PRN
  Administered 2019-04-05: 250 mg via INTRAVENOUS

## 2019-04-05 MED ORDER — ONDANSETRON HCL 4 MG/2ML IJ SOLN
INTRAMUSCULAR | Status: AC
  Filled 2019-04-05: qty 2

## 2019-04-05 MED ORDER — PROMETHAZINE HCL 25 MG/ML IJ SOLN
INTRAMUSCULAR | Status: AC
  Filled 2019-04-05: qty 1

## 2019-04-05 MED ORDER — CHLORHEXIDINE GLUCONATE CLOTH 2 % EX PADS: 6.0000 | MEDICATED_PAD | Freq: Once | CUTANEOUS | Status: DC

## 2019-04-05 MED ORDER — PROPOFOL 10 MG/ML IV BOLUS
INTRAVENOUS | Status: DC | PRN
  Administered 2019-04-05: 120 mg via INTRAVENOUS

## 2019-04-05 MED ORDER — SUGAMMADEX SODIUM 500 MG/5ML IV SOLN
INTRAVENOUS | Status: AC
  Filled 2019-04-05: qty 5

## 2019-04-05 MED ORDER — ACETAMINOPHEN 500 MG PO TABS: 1000.0000 mg | ORAL_TABLET | Freq: Once | ORAL | Status: DC

## 2019-04-05 MED ORDER — PROPOFOL 10 MG/ML IV BOLUS
INTRAVENOUS | Status: AC
  Filled 2019-04-05: qty 20

## 2019-04-05 MED ORDER — FENTANYL CITRATE (PF) 100 MCG/2ML IJ SOLN
INTRAMUSCULAR | Status: AC
  Filled 2019-04-05: qty 2

## 2019-04-05 MED ORDER — FENTANYL CITRATE (PF) 250 MCG/5ML IJ SOLN
INTRAMUSCULAR | Status: DC | PRN
  Administered 2019-04-05 (×4): 50 ug via INTRAVENOUS

## 2019-04-05 MED ORDER — LIDOCAINE 2% (20 MG/ML) 5 ML SYRINGE
INTRAMUSCULAR | Status: DC | PRN
  Administered 2019-04-05: 50 mg via INTRAVENOUS

## 2019-04-05 MED ORDER — ONDANSETRON HCL 4 MG/2ML IJ SOLN
INTRAMUSCULAR | Status: DC | PRN
  Administered 2019-04-05: 4 mg via INTRAVENOUS

## 2019-04-05 MED ORDER — LACTATED RINGERS IV SOLN: INTRAVENOUS | Status: DC

## 2019-04-05 MED ORDER — BUPIVACAINE HCL (PF) 0.25 % IJ SOLN
INTRAMUSCULAR | Status: AC
  Filled 2019-04-05: qty 30

## 2019-04-05 MED ORDER — DEXAMETHASONE SODIUM PHOSPHATE 10 MG/ML IJ SOLN
INTRAMUSCULAR | Status: DC | PRN
  Administered 2019-04-05: 4 mg via INTRAVENOUS

## 2019-04-05 MED ORDER — MIDAZOLAM HCL 2 MG/2ML IJ SOLN
INTRAMUSCULAR | Status: AC
  Filled 2019-04-05: qty 2

## 2019-04-05 MED ORDER — PROMETHAZINE HCL 25 MG/ML IJ SOLN
6.2500 mg | Freq: Once | INTRAMUSCULAR | Status: AC
  Administered 2019-04-05: 6.25 mg via INTRAVENOUS

## 2019-04-05 MED ORDER — MIDAZOLAM HCL 5 MG/5ML IJ SOLN
INTRAMUSCULAR | Status: DC | PRN
  Administered 2019-04-05: 2 mg via INTRAVENOUS

## 2019-04-05 MED ORDER — SODIUM CHLORIDE 0.9 % IR SOLN
Status: DC | PRN
  Administered 2019-04-05: 1000 mL

## 2019-04-05 MED ORDER — FENTANYL CITRATE (PF) 100 MCG/2ML IJ SOLN: 25.0000 ug | INTRAMUSCULAR | Status: DC | PRN

## 2019-04-05 MED ORDER — OXYCODONE HCL 5 MG PO TABS: 5.0000 mg | ORAL_TABLET | Freq: Four times a day (QID) | ORAL | 0 refills | Status: DC | PRN

## 2019-04-05 MED ORDER — SODIUM CHLORIDE 0.9 % IV SOLN
INTRAVENOUS | Status: DC | PRN
  Administered 2019-04-05: 7 mL

## 2019-04-05 MED ORDER — KETOROLAC TROMETHAMINE 30 MG/ML IJ SOLN
INTRAMUSCULAR | Status: DC | PRN
  Administered 2019-04-05: 30 mg via INTRAVENOUS

## 2019-04-05 MED ORDER — BUPIVACAINE HCL (PF) 0.25 % IJ SOLN
INTRAMUSCULAR | Status: DC | PRN
  Administered 2019-04-05: 15 mL

## 2019-04-05 MED ORDER — CEFAZOLIN SODIUM-DEXTROSE 2-4 GM/100ML-% IV SOLN
INTRAVENOUS | Status: AC
  Filled 2019-04-05: qty 100

## 2019-04-05 MED ORDER — ROCURONIUM BROMIDE 10 MG/ML (PF) SYRINGE
PREFILLED_SYRINGE | INTRAVENOUS | Status: DC | PRN
  Administered 2019-04-05: 70 mg via INTRAVENOUS

## 2019-04-05 MED ORDER — ACETAMINOPHEN 500 MG PO TABS
ORAL_TABLET | ORAL | Status: AC
  Filled 2019-04-05: qty 2

## 2019-04-05 MED ORDER — ROCURONIUM BROMIDE 10 MG/ML (PF) SYRINGE
PREFILLED_SYRINGE | INTRAVENOUS | Status: AC
  Filled 2019-04-05: qty 10

## 2019-04-05 MED ORDER — GABAPENTIN 300 MG PO CAPS: 300.0000 mg | ORAL_CAPSULE | ORAL | Status: DC

## 2019-04-05 MED ORDER — DEXAMETHASONE SODIUM PHOSPHATE 10 MG/ML IJ SOLN
INTRAMUSCULAR | Status: AC
  Filled 2019-04-05: qty 1

## 2019-04-05 MED ORDER — CEFAZOLIN SODIUM-DEXTROSE 2-4 GM/100ML-% IV SOLN
2.0000 g | INTRAVENOUS | Status: AC
  Administered 2019-04-05: 10:00:00 2 g via INTRAVENOUS

## 2019-04-05 SURGICAL SUPPLY — 41 items
APL PRP STRL LF DISP 70% ISPRP (MISCELLANEOUS) ×1
APL SKNCLS STERI-STRIP NONHPOA (GAUZE/BANDAGES/DRESSINGS) ×1
APPLIER CLIP ROT 10 11.4 M/L (STAPLE) ×3
APR CLP MED LRG 11.4X10 (STAPLE) ×1
BAG SPEC RTRVL LRG 6X4 10 (ENDOMECHANICALS) ×1
BENZOIN TINCTURE PRP APPL 2/3 (GAUZE/BANDAGES/DRESSINGS) ×3 IMPLANT
BLADE CLIPPER SURG (BLADE) ×3 IMPLANT
CHLORAPREP W/TINT 26 (MISCELLANEOUS) ×3 IMPLANT
CLIP APPLIE ROT 10 11.4 M/L (STAPLE) ×1 IMPLANT
CLOSURE WOUND 1/2 X4 (GAUZE/BANDAGES/DRESSINGS) ×1
COVER MAYO STAND STRL (DRAPES) ×3 IMPLANT
COVER WAND RF STERILE (DRAPES) IMPLANT
DECANTER SPIKE VIAL GLASS SM (MISCELLANEOUS) IMPLANT
DRAPE C-ARM 42X72 X-RAY (DRAPES) ×3 IMPLANT
DRSG TEGADERM 2-3/8X2-3/4 SM (GAUZE/BANDAGES/DRESSINGS) ×9 IMPLANT
DRSG TEGADERM 4X4.75 (GAUZE/BANDAGES/DRESSINGS) ×3 IMPLANT
ELECT REM PT RETURN 9FT ADLT (ELECTROSURGICAL) ×3
ELECTRODE REM PT RTRN 9FT ADLT (ELECTROSURGICAL) ×1 IMPLANT
GLOVE BIO SURGEON STRL SZ7 (GLOVE) ×3 IMPLANT
GLOVE BIOGEL PI IND STRL 7.5 (GLOVE) ×1 IMPLANT
GLOVE BIOGEL PI INDICATOR 7.5 (GLOVE) ×2
HEMOSTAT SNOW SURGICEL 2X4 (HEMOSTASIS) IMPLANT
NS IRRIG 1000ML POUR BTL (IV SOLUTION) ×3 IMPLANT
PACK BASIN DAY SURGERY FS (CUSTOM PROCEDURE TRAY) ×3 IMPLANT
POUCH SPECIMEN RETRIEVAL 10MM (ENDOMECHANICALS) ×3 IMPLANT
SCISSORS LAP 5X35 DISP (ENDOMECHANICALS) ×3 IMPLANT
SET CHOLANGIOGRAPH 5 50 .035 (SET/KITS/TRAYS/PACK) ×3 IMPLANT
SET IRRIG TUBING LAPAROSCOPIC (IRRIGATION / IRRIGATOR) ×3 IMPLANT
SET TUBE SMOKE EVAC HIGH FLOW (TUBING) ×3 IMPLANT
SLEEVE ENDOPATH XCEL 5M (ENDOMECHANICALS) ×3 IMPLANT
SLEEVE SCD COMPRESS KNEE MED (MISCELLANEOUS) ×3 IMPLANT
SPONGE GAUZE 2X2 8PLY STER LF (GAUZE/BANDAGES/DRESSINGS) ×3
SPONGE GAUZE 2X2 8PLY STRL LF (GAUZE/BANDAGES/DRESSINGS) ×6 IMPLANT
STRIP CLOSURE SKIN 1/2X4 (GAUZE/BANDAGES/DRESSINGS) ×2 IMPLANT
SUT MNCRL AB 4-0 PS2 18 (SUTURE) ×3 IMPLANT
SUT VICRYL 0 UR6 27IN ABS (SUTURE) IMPLANT
TOWEL GREEN STERILE FF (TOWEL DISPOSABLE) ×3 IMPLANT
TRAY LAPAROSCOPIC (CUSTOM PROCEDURE TRAY) ×3 IMPLANT
TROCAR XCEL BLUNT TIP 100MML (ENDOMECHANICALS) ×3 IMPLANT
TROCAR XCEL NON-BLD 11X100MML (ENDOMECHANICALS) ×3 IMPLANT
TROCAR XCEL NON-BLD 5MMX100MML (ENDOMECHANICALS) ×3 IMPLANT

## 2019-04-05 NOTE — Interval H&P Note (Signed)
History and Physical Interval Note:  04/05/2019 10:04 AM  Susan Baird  has presented today for surgery, with the diagnosis of CHRONIC CHOLECYSTITIS.  The various methods of treatment have been discussed with the patient and family. After consideration of risks, benefits and other options for treatment, the patient has consented to  Procedure(s): LAPAROSCOPIC CHOLECYSTECTOMY WITH INTRAOPERATIVE CHOLANGIOGRAM (N/A) as a surgical intervention.  The patient's history has been reviewed, patient examined, no change in status, stable for surgery.  I have reviewed the patient's chart and labs.  Questions were answered to the patient's satisfaction.     Susan Baird

## 2019-04-05 NOTE — Anesthesia Procedure Notes (Signed)
Procedure Name: Intubation Date/Time: 04/05/2019 10:23 AM Performed by: Myna Bright, CRNA Pre-anesthesia Checklist: Emergency Drugs available, Patient identified, Suction available and Patient being monitored Patient Re-evaluated:Patient Re-evaluated prior to induction Oxygen Delivery Method: Circle system utilized Preoxygenation: Pre-oxygenation with 100% oxygen Induction Type: IV induction Ventilation: Mask ventilation without difficulty Laryngoscope Size: Mac and 3 Grade View: Grade II Tube type: Oral Tube size: 7.0 mm Number of attempts: 1 Airway Equipment and Method: Stylet Placement Confirmation: ETT inserted through vocal cords under direct vision,  positive ETCO2 and breath sounds checked- equal and bilateral Secured at: 21 cm Tube secured with: Tape Dental Injury: Teeth and Oropharynx as per pre-operative assessment

## 2019-04-05 NOTE — Anesthesia Preprocedure Evaluation (Addendum)
Anesthesia Evaluation  Patient identified by MRN, date of birth, ID band Patient awake    Reviewed: Allergy & Precautions, NPO status , Patient's Chart, lab work & pertinent test results  Airway Mallampati: I  TM Distance: >3 FB Neck ROM: Full    Dental no notable dental hx. (+) Teeth Intact, Dental Advisory Given   Pulmonary neg pulmonary ROS, Current SmokerPatient did not abstain from smoking.,    Pulmonary exam normal breath sounds clear to auscultation       Cardiovascular negative cardio ROS Normal cardiovascular exam Rhythm:Regular Rate:Normal     Neuro/Psych negative neurological ROS  negative psych ROS   GI/Hepatic Neg liver ROS, GERD  Medicated,  Endo/Other  negative endocrine ROS  Renal/GU negative Renal ROS  negative genitourinary   Musculoskeletal negative musculoskeletal ROS (+)   Abdominal   Peds  Hematology negative hematology ROS (+)   Anesthesia Other Findings   Reproductive/Obstetrics                            Anesthesia Physical Anesthesia Plan  ASA: II  Anesthesia Plan: General   Post-op Pain Management:    Induction: Intravenous  PONV Risk Score and Plan: 2 and Midazolam, Dexamethasone and Ondansetron  Airway Management Planned: Oral ETT  Additional Equipment:   Intra-op Plan:   Post-operative Plan: Extubation in OR  Informed Consent: I have reviewed the patients History and Physical, chart, labs and discussed the procedure including the risks, benefits and alternatives for the proposed anesthesia with the patient or authorized representative who has indicated his/her understanding and acceptance.     Dental advisory given  Plan Discussed with: CRNA  Anesthesia Plan Comments:         Anesthesia Quick Evaluation

## 2019-04-05 NOTE — Anesthesia Postprocedure Evaluation (Signed)
Anesthesia Post Note  Patient: Susan Baird  Procedure(s) Performed: LAPAROSCOPIC CHOLECYSTECTOMY WITH INTRAOPERATIVE CHOLANGIOGRAM (N/A Abdomen)     Patient location during evaluation: PACU Anesthesia Type: General Level of consciousness: awake and alert Pain management: pain level controlled Vital Signs Assessment: post-procedure vital signs reviewed and stable Respiratory status: spontaneous breathing, nonlabored ventilation, respiratory function stable and patient connected to nasal cannula oxygen Cardiovascular status: blood pressure returned to baseline and stable Postop Assessment: no apparent nausea or vomiting Anesthetic complications: no    Last Vitals:  Vitals:   04/05/19 1207 04/05/19 1230  BP: 121/68 (!) 136/58  Pulse:  73  Resp: 15 16  Temp:  36.4 C  SpO2: 97% 97%    Last Pain:  Vitals:   04/05/19 1230  TempSrc:   PainSc: 0-No pain                 Vedika Dumlao L Destinee Taber

## 2019-04-05 NOTE — Op Note (Signed)
Laparoscopic Cholecystectomy with IOC Procedure Note  Indications: This patient presents with symptomatic gallbladder disease and will undergo laparoscopic cholecystectomy.  Pre-operative Diagnosis: Calculus of gallbladder with other cholecystitis, without mention of obstruction  Post-operative Diagnosis: Same  Surgeon: Maia Petties   Assistants: Pryor Curia, RNFA  Anesthesia: General endotracheal anesthesia  ASA Class: 2  Procedure Details  The patient was seen again in the Holding Room. The risks, benefits, complications, treatment options, and expected outcomes were discussed with the patient. The possibilities of reaction to medication, pulmonary aspiration, perforation of viscus, bleeding, recurrent infection, finding a normal gallbladder, the need for additional procedures, failure to diagnose a condition, the possible need to convert to an open procedure, and creating a complication requiring transfusion or operation were discussed with the patient. The likelihood of improving the patient's symptoms with return to their baseline status is good.  The patient and/or family concurred with the proposed plan, giving informed consent. The site of surgery properly noted. The patient was taken to Operating Room, identified as Susan Baird and the procedure verified as Laparoscopic Cholecystectomy with Intraoperative Cholangiogram. A Time Out was held and the above information confirmed.  Prior to the induction of general anesthesia, antibiotic prophylaxis was administered. General endotracheal anesthesia was then administered and tolerated well. After the induction, the abdomen was prepped with Chloraprep and draped in the sterile fashion. The patient was positioned in the supine position.  Local anesthetic agent was injected into the skin below the umbilicus and an incision made. We dissected down to the abdominal fascia with blunt dissection.  The fascia was incised vertically and we  entered the peritoneal cavity bluntly.  A pursestring suture of 0-Vicryl was placed around the fascial opening.  The Hasson cannula was inserted and secured with the stay suture.  Pneumoperitoneum was then created with CO2 and tolerated well without any adverse changes in the patient's vital signs. An 11-mm port was placed in the subxiphoid position.  Two 5-mm ports were placed in the right upper quadrant. All skin incisions were infiltrated with a local anesthetic agent before making the incision and placing the trocars.   We positioned the patient in reverse Trendelenburg, tilted slightly to the patient's left.  The gallbladder was identified, the fundus grasped and retracted cephalad. There are minimal adhesions to the gallbladder.  The gallbladder is distended and fairly long.  Adhesions were lysed bluntly and with the electrocautery where indicated, taking care not to injure any adjacent organs or viscus. The infundibulum was grasped and retracted laterally, exposing the peritoneum overlying the triangle of Calot. This was then divided and exposed in a blunt fashion. A critical view of the cystic duct and cystic artery was obtained.  The cystic duct was clearly identified and bluntly dissected circumferentially. The cystic duct was ligated with a clip distally.   An incision was made in the cystic duct and the [facility] cholangiogram catheter introduced. The catheter was secured using a clip. A cholangiogram was then obtained which showed good visualization of the distal and proximal biliary tree with no sign of filling defects or obstruction.  Contrast flowed easily into the duodenum. The catheter was then removed.   The cystic duct was then ligated with clips and divided. The cystic artery was identified, dissected free, ligated with clips and divided as well.   The gallbladder was dissected from the liver bed in retrograde fashion with the electrocautery. The gallbladder was removed and placed in an  Endocatch sac. The liver bed was  irrigated and inspected. Hemostasis was achieved with the electrocautery. Copious irrigation was utilized and was repeatedly aspirated until clear.  The gallbladder and Endocatch sac were then removed through the umbilical port site.  The pursestring suture was used to close the umbilical fascia.    We again inspected the right upper quadrant for hemostasis.  Pneumoperitoneum was released as we removed the trocars.  4-0 Monocryl was used to close the skin.   Benzoin, steri-strips, and clean dressings were applied. The patient was then extubated and brought to the recovery room in stable condition. Instrument, sponge, and needle counts were correct at closure and at the conclusion of the case.   Findings: Cholecystitis with Cholelithiasis  Estimated Blood Loss: Minimal         Drains: none         Specimens: Gallbladder           Complications: None; patient tolerated the procedure well.         Disposition: PACU - hemodynamically stable.         Condition: stable  Susan Baird. Susan Skains, MD, [facility]  General/ Trauma Surgery   04/05/2019 11:13 AM

## 2019-04-05 NOTE — Transfer of Care (Signed)
Immediate Anesthesia Transfer of Care Note  Patient: Susan Baird  Procedure(s) Performed: LAPAROSCOPIC CHOLECYSTECTOMY WITH INTRAOPERATIVE CHOLANGIOGRAM (N/A Abdomen)  Patient Location: PACU  Anesthesia Type:General  Level of Consciousness: sedated  Airway & Oxygen Therapy: Patient Spontanous Breathing and Patient connected to face mask oxygen  Post-op Assessment: Report given to RN and Post -op Vital signs reviewed and stable  Post vital signs: Reviewed and stable  Last Vitals:  Vitals Value Taken Time  BP 130/56 04/05/19 1131  Temp    Pulse 75 04/05/19 1134  Resp 16 04/05/19 1134  SpO2 100 % 04/05/19 1134  Vitals shown include unvalidated device data.  Last Pain:  Vitals:   04/05/19 0844  TempSrc: Temporal  PainSc: 0-No pain         Complications: No apparent anesthesia complications

## 2019-04-05 NOTE — Discharge Instructions (Signed)
[facility] ______[facility] LAPAROSCOPIC SURGERY: POST OP INSTRUCTIONS Always review your discharge instruction sheet given to you by the facility where your surgery was performed. IF YOU HAVE DISABILITY OR FAMILY LEAVE FORMS, YOU MUST BRING THEM TO THE OFFICE FOR PROCESSING.   DO NOT GIVE THEM TO YOUR DOCTOR.  1. A prescription for pain medication may be given to you upon discharge.  Take your pain medication as prescribed, if needed.  If narcotic pain medicine is not needed, then you may take acetaminophen (Tylenol) or ibuprofen (Advil) as needed. 2. Take your usually prescribed medications unless otherwise directed. 3. If you need a refill on your pain medication, please contact your pharmacy.  They will contact our office to request authorization. Prescriptions will not be filled after 5pm or on week-ends. 4. You should follow a light diet the first few days after arrival home, such as soup and crackers, etc.  Be sure to include lots of fluids daily. 5. Most patients will experience some swelling and bruising in the area of the incisions.  Ice packs will help.  Swelling and bruising can take several days to resolve.  6. It is common to experience some constipation if taking pain medication after surgery.  Increasing fluid intake and taking a stool softener (such as Colace) will usually help or prevent this problem from occurring.  A mild laxative (Milk of Magnesia or Miralax) should be taken according to package instructions if there are no bowel movements after 48 hours. 7. Unless discharge instructions indicate otherwise, you may remove your bandages 24-48 hours after surgery, and you may shower at that time.  You may have steri-strips (small skin tapes) in place directly over the incision.  These strips should be left on the skin for 7-10 days.  If your surgeon used skin glue on the incision, you may shower in 24 hours.  The glue will flake off over the next 2-3 weeks.  Any sutures or  staples will be removed at the office during your follow-up visit. 8. ACTIVITIES:  You may resume regular (light) daily activities beginning the next day--such as daily self-care, walking, climbing stairs--gradually increasing activities as tolerated.  You may have sexual intercourse when it is comfortable.  Refrain from any heavy lifting or straining until approved by your doctor. a. You may drive when you are no longer taking prescription pain medication, you can comfortably wear a seatbelt, and you can safely maneuver your car and apply brakes. b. RETURN TO WORK:  __________________________________________________________ 9. You should see your doctor in the office for a follow-up appointment approximately 2-3 weeks after your surgery.  Make sure that you call for this appointment within a day or two after you arrive home to insure a convenient appointment time. 10. OTHER INSTRUCTIONS: __________________________________________________________________________________________________________________________ __________________________________________________________________________________________________________________________ WHEN TO CALL YOUR DOCTOR: 1. Fever over 101.0 2. Inability to urinate 3. Continued bleeding from incision. 4. Increased pain, redness, or drainage from the incision. 5. Increasing abdominal pain  The clinic staff is available to answer your questions during regular business hours.  Please don't hesitate to call and ask to speak to one of the nurses for clinical concerns.  If you have a medical emergency, go to the nearest emergency room or call 911.  A surgeon from [facility] is always on call at the hospital. [address] [place], [place], [place]  [address] ? [address], [place], [place]   [address] [phone] ? [phone] ? FAX [phone] Web site:  [email or web address]   Post Anesthesia Home Care Instructions  Activity: Get  plenty of rest for the remainder of the day. A responsible individual must stay with you for 24 hours following the procedure.  For the next 24 hours, DO NOT: -Drive a car -Advertising copywriter -Drink alcoholic beverages -Take any medication unless instructed by your physician -Make any legal decisions or sign important papers.  Meals: Start with liquid foods such as gelatin or soup. Progress to regular foods as tolerated. Avoid greasy, spicy, heavy foods. If nausea and/or vomiting occur, drink only clear liquids until the nausea and/or vomiting subsides. Call your physician if vomiting continues.  Special Instructions/Symptoms: Your throat may feel dry or sore from the anesthesia or the breathing tube placed in your throat during surgery. If this causes discomfort, gargle with warm salt water. The discomfort should disappear within 24 hours.  If you had a scopolamine patch placed behind your ear for the management of post- operative nausea and/or vomiting:  1. The medication in the patch is effective for 72 hours, after which it should be removed.  Wrap patch in a tissue and discard in the trash. Wash hands thoroughly with soap and water. 2. You may remove the patch earlier than 72 hours if you experience unpleasant side effects which may include dry mouth, dizziness or visual disturbances. 3. Avoid touching the patch. Wash your hands with soap and water after contact with the patch.  No tylenol today until after 2:30pm.  No ibuprofen until after 5pm today

## 2019-04-06 ENCOUNTER — Encounter: Payer: Self-pay | Admitting: *Deleted

## 2019-04-06 LAB — SURGICAL PATHOLOGY

## 2021-03-20 ENCOUNTER — Telehealth (INDEPENDENT_AMBULATORY_CARE_PROVIDER_SITE_OTHER): Payer: Self-pay | Admitting: Family Medicine

## 2021-03-20 DIAGNOSIS — J069 Acute upper respiratory infection, unspecified: Secondary | ICD-10-CM

## 2021-03-20 DIAGNOSIS — Z720 Tobacco use: Secondary | ICD-10-CM

## 2021-03-20 MED ORDER — ALBUTEROL SULFATE HFA 108 (90 BASE) MCG/ACT IN AERS: 2.0000 | INHALATION_SPRAY | Freq: Four times a day (QID) | RESPIRATORY_TRACT | 0 refills | Status: DC | PRN

## 2021-03-20 MED ORDER — AMOXICILLIN-POT CLAVULANATE 875-125 MG PO TABS: 1.0000 | ORAL_TABLET | Freq: Two times a day (BID) | ORAL | 0 refills | Status: DC

## 2021-03-20 MED ORDER — PREDNISONE 20 MG PO TABS: ORAL_TABLET | ORAL | 0 refills | Status: DC

## 2021-03-20 NOTE — Progress Notes (Signed)
Phone visit  Subjective: CC: URI PCP: Raliegh Ip, DO Susan Baird is a 66 y.o. female. Patient provides verbal consent for consult held via phone.  Due to COVID-19 pandemic this visit was conducted virtually. This visit type was conducted due to national recommendations for restrictions regarding the COVID-19 Pandemic (e.g. social distancing, sheltering in place) in an effort to limit this patient's exposure and mitigate transmission in our community. All issues noted in this document were discussed and addressed.  A physical exam was not performed with this format.   Location of patient: home Location of provider: [facility] Others present for call: none  1. Sinusitis Patient reports that about 3-4 days ago she started having ear fullness, sinus pressure.  Last night she started coughing, sneezing, sore throat.  No brown or blood in cough.  No shortness of breath or wheezing but she is having chest tightness. No nausea, vomiting diarrhea or fevers.  She has been using her daily allergy pill and another prn allergy pill at night.  She is also using saline rinses but nothing is really helping.  She has a sick contacts, including one of her husband's coworkers.  Still smoking.  She is testing herself for COVID 19 now.  ROS: Per HPI  No Known Allergies Past Medical History:  Diagnosis Date   Allergies    Kidney stone     Current Outpatient Medications:    Ascorbic Acid (VITAMIN C) 1000 MG tablet, Take 1,000 mg by mouth daily., Disp: , Rfl:    cetirizine (ZYRTEC) 10 MG tablet, Take 10 mg by mouth daily., Disp: , Rfl:    Cholecalciferol (VITAMIN D3) 30 MCG/15ML LIQD, Take by mouth., Disp: , Rfl:    Cyanocobalamin (VITAMIN B 12 PO), Take by mouth., Disp: , Rfl:    ELDERBERRY PO, Take by mouth., Disp: , Rfl:    fluticasone (FLONASE) 50 MCG/ACT nasal spray, Place into both nostrils daily., Disp: , Rfl:    Magnesium 250 MG TABS, Take by mouth., Disp: , Rfl:    Multiple Vitamin  (MULTIVITAMIN ADULT PO), Take by mouth., Disp: , Rfl:    omeprazole (PRILOSEC) 20 MG capsule, Take 1 capsule (20 mg total) by mouth daily., Disp: 30 capsule, Rfl: 3   oxyCODONE (OXY IR/ROXICODONE) 5 MG immediate release tablet, Take 1 tablet (5 mg total) by mouth every 6 (six) hours as needed for severe pain., Disp: 15 tablet, Rfl: 0   Potassium 95 MG TABS, Take by mouth. Takes 3 times a week OTC, Disp: , Rfl:    Probiotic Product (PROBIOTIC PO), Take by mouth., Disp: , Rfl:   Assessment/ Plan: 65 y.o. female   URI with cough and congestion - Plan: albuterol (VENTOLIN HFA) 108 (90 Base) MCG/ACT inhaler, predniSONE (DELTASONE) 20 MG tablet, amoxicillin-clavulanate (AUGMENTIN) 875-125 MG tablet  Tobacco use - Plan: albuterol (VENTOLIN HFA) 108 (90 Base) MCG/ACT inhaler, predniSONE (DELTASONE) 20 MG tablet, amoxicillin-clavulanate (AUGMENTIN) 875-125 MG tablet  Suspect this is a viral URI.  Because she is a smoker and there is uncertainty diagnosis of COPD I am going to empirically treat her with oral antibiotics and steroid.  Albuterol inhaler also sent.  She tested negative for COVID-19 by home test.  We discussed red flag signs and symptoms warranting further evaluation.  She voiced understanding will follow-up as needed  Start time: 12:54pm; called back to review plan 2:00pm End time: 1:06pm: 2:06pm  Total time spent on patient care (including phone visit/ documentation): 18 minutes  Susan Darnell Hulen Skains,  DO [place] [phone]

## 2021-10-19 IMAGING — US US ABDOMEN LIMITED
1 series · 14 of 25 positions shown · non-contrast
Comparison: CT abdomen and pelvis April 13, 2014

CLINICAL DATA: Upper abdominal pain

EXAM:
ULTRASOUND ABDOMEN LIMITED RIGHT UPPER QUADRANT

[Series 1: us abdomen limited · 14 of 37 slices shown]
[im 1/37]
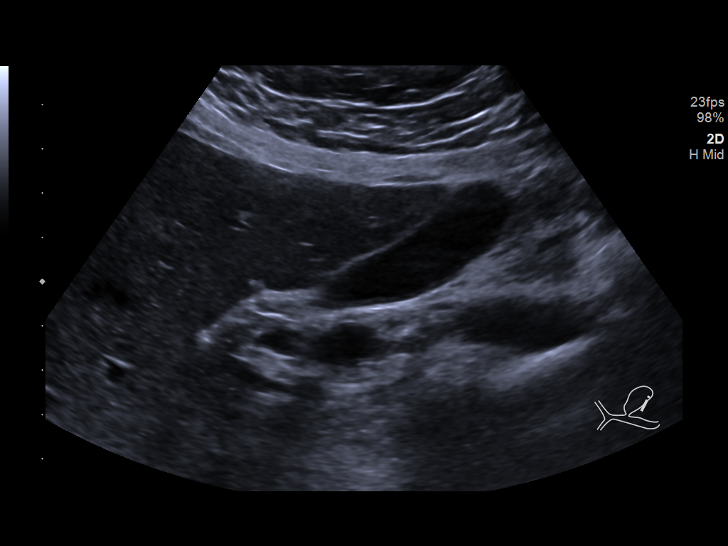
[im 4/37]
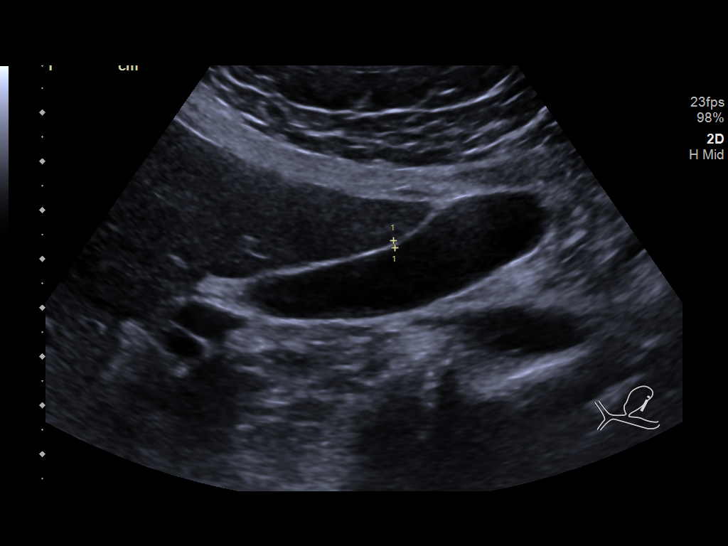
[im 7/37]
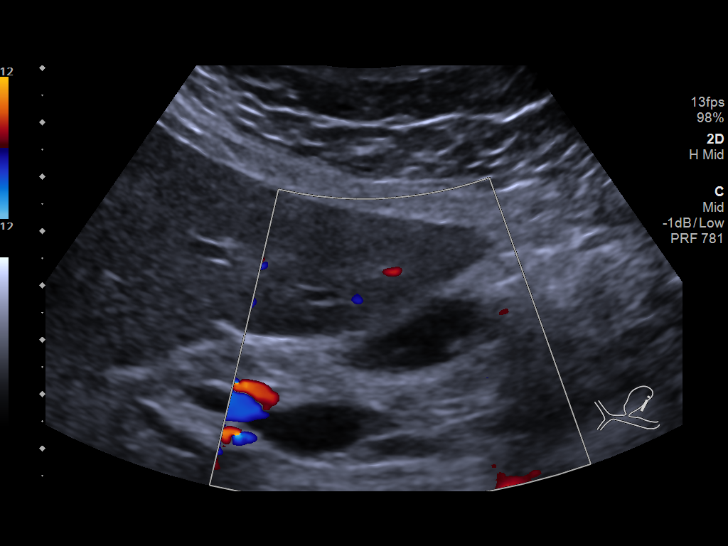
[im 10/37]
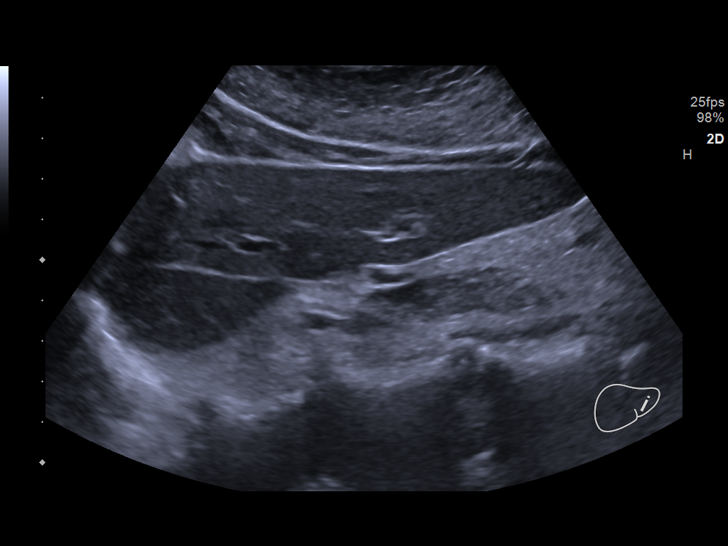
[im 13/37]
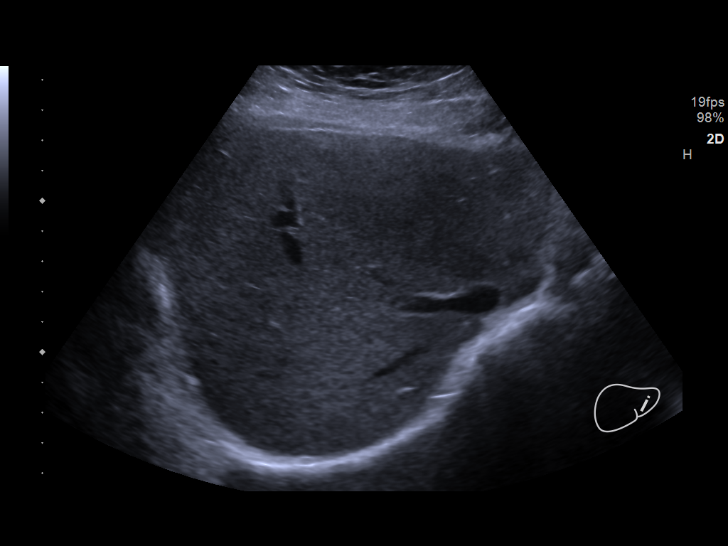
[im 14/37]
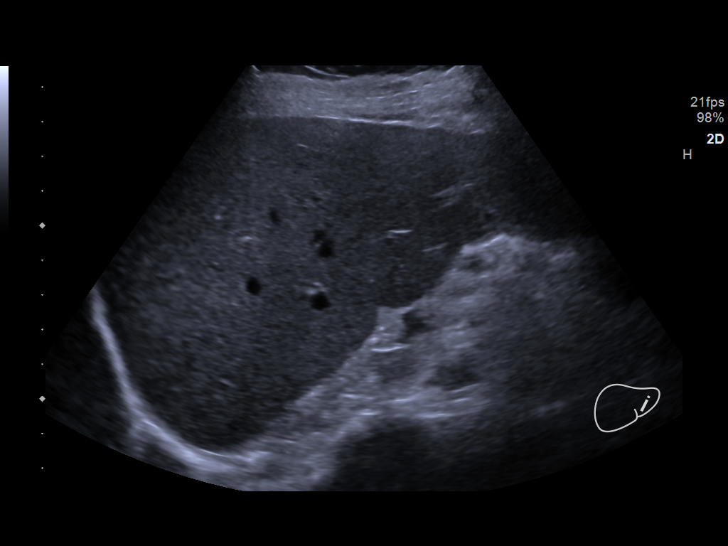
[im 17/37]
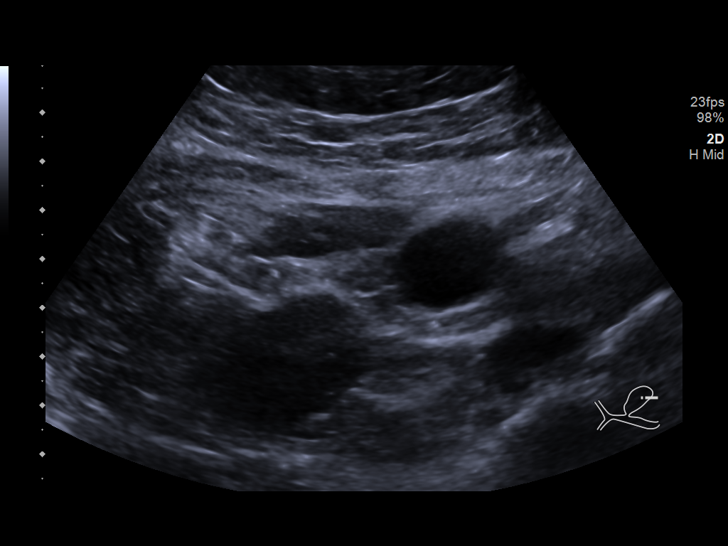
[im 20/37]
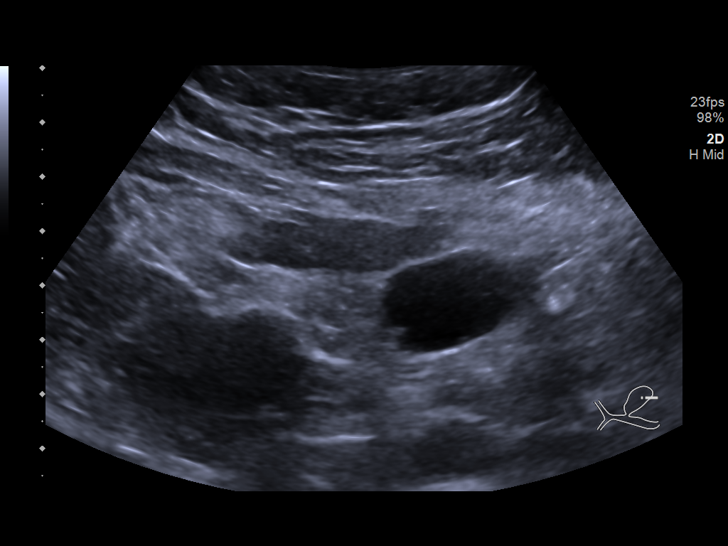
[im 23/37]
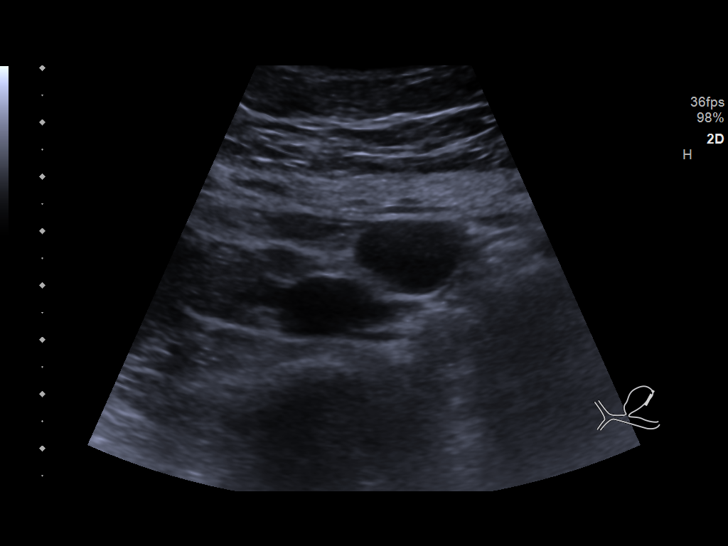
[im 25/37]
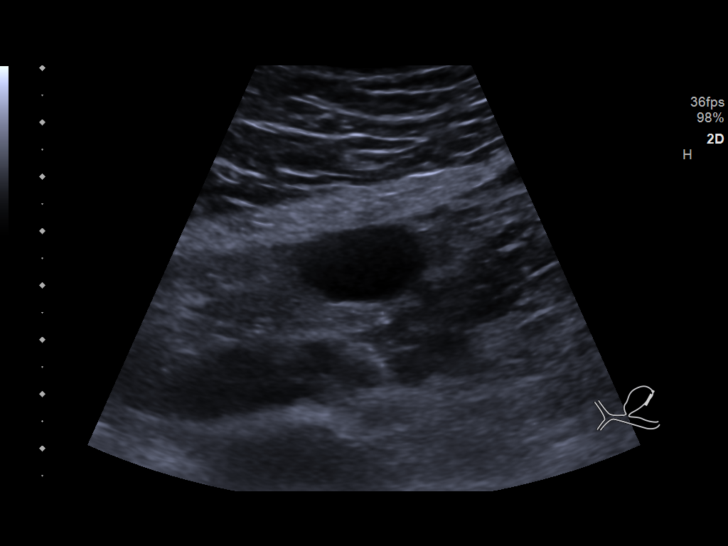
[im 28/37]
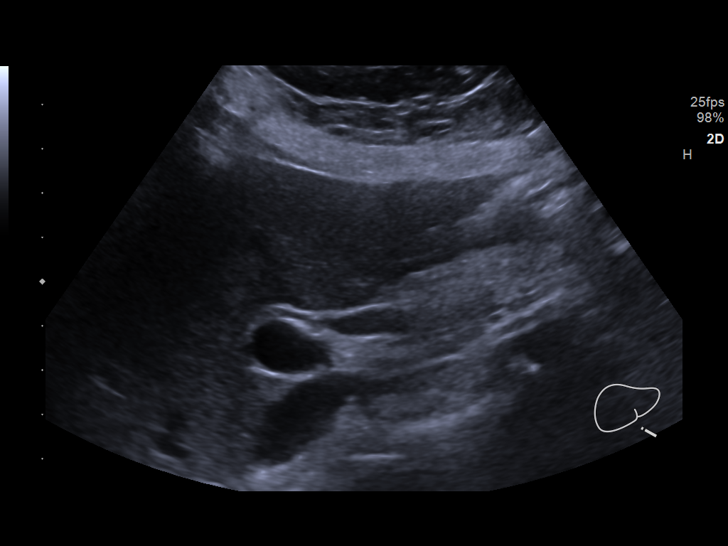
[im 31/37]
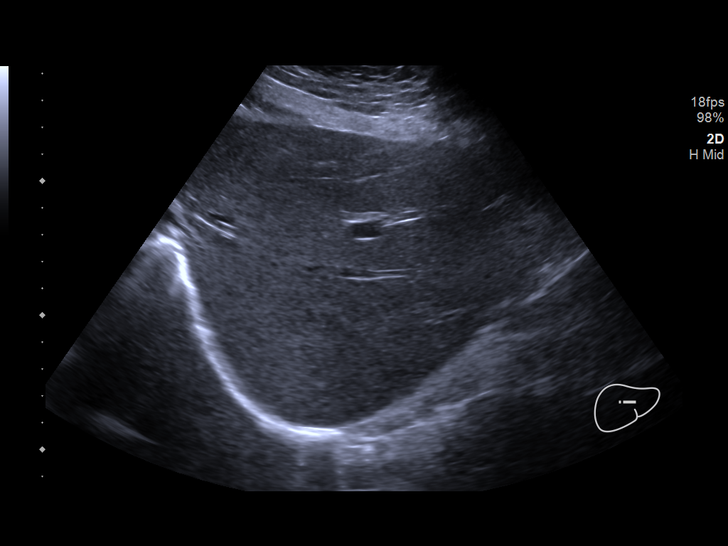
[im 34/37]
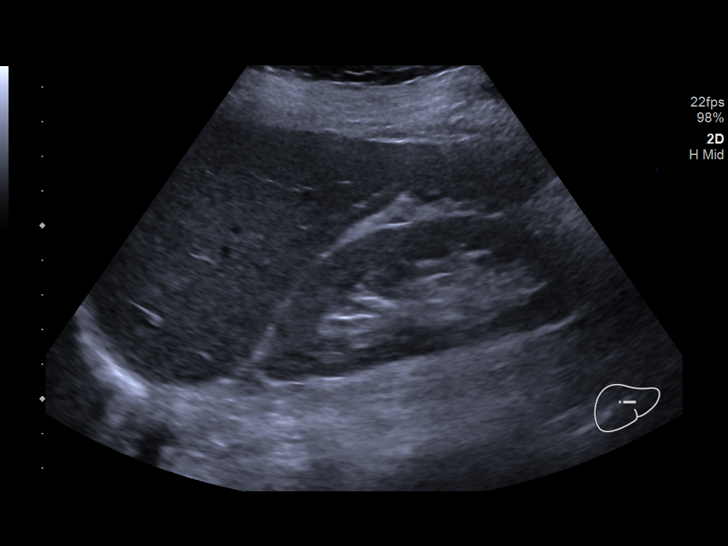
[im 37/37]
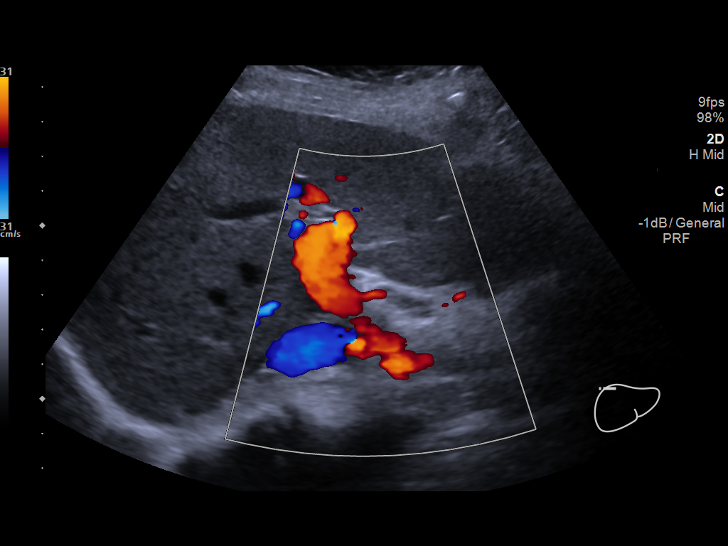

[14 of 25 positions shown; findings below may reference images not displayed]

FINDINGS: Gallbladder:

Within the gallbladder, there is a 3 mm echogenic focus which
neither moves nor shadows, an apparent polyp. There are no echogenic
foci in the gallbladder which move and shadow as is expected with
gallstones. No gallbladder wall thickening or pericholecystic fluid.
No sonographic Murphy sign noted by sonographer.

Common bile duct:

Diameter: 6 mm. No intrahepatic or extrahepatic biliary duct
dilatation.

Liver:

No focal lesion identified. Within normal limits in parenchymal
echogenicity. Portal vein is patent on color Doppler imaging with
normal direction of blood flow towards the liver.

Other: None.
IMPRESSION: 1. 3 mm apparent polyp in the gallbladder. Per consensus guidelines,
a polyp of this size does not warrant additional imaging
surveillance. No gallstones, gallbladder wall thickening, or
pericholecystic fluid.

2.  Study otherwise unremarkable.

## 2021-12-22 ENCOUNTER — Encounter: Payer: Self-pay | Admitting: Family Medicine

## 2021-12-22 ENCOUNTER — Telehealth (INDEPENDENT_AMBULATORY_CARE_PROVIDER_SITE_OTHER): Payer: Self-pay | Admitting: Family Medicine

## 2021-12-22 DIAGNOSIS — H938X3 Other specified disorders of ear, bilateral: Secondary | ICD-10-CM

## 2021-12-22 DIAGNOSIS — J0101 Acute recurrent maxillary sinusitis: Secondary | ICD-10-CM

## 2021-12-22 MED ORDER — AMOXICILLIN-POT CLAVULANATE 875-125 MG PO TABS: 1.0000 | ORAL_TABLET | Freq: Two times a day (BID) | ORAL | 0 refills | Status: DC

## 2021-12-22 NOTE — Progress Notes (Signed)
Virtual Visit via telephone Note  I connected with Susan Baird on 12/22/21 at 1308 by telephone and verified that I am speaking with the correct person using two identifiers. Susan Baird is currently located at home and patient are currently with her during visit. The provider, Elige Radon Nastasia Kage, MD is located in their office at time of visit.  Call ended at 1315  I discussed the limitations, risks, security and privacy concerns of performing an evaluation and management service by telephone and the availability of in person appointments. I also discussed with the patient that there may be a patient responsible charge related to this service. The patient expressed understanding and agreed to proceed.   History and Present Illness: Patient is calling in for 2 weeks of coughing and chest congestion and sinus pressure and off and on earache.  She has improved some but still has a lot of pressure and pain in the ear.  She is still having a clear cough. She has been using the mucinex sever and nasal saline 2 times daily and advil and tylenol and they do help some. She did a home covid test and it was negative.  She takes a generic zyrtec everyday.   1. Pressure sensation in both ears   2. Acute recurrent maxillary sinusitis     Outpatient Encounter Medications as of 12/22/2021  Medication Sig   amoxicillin-clavulanate (AUGMENTIN) 875-125 MG tablet Take 1 tablet by mouth 2 (two) times daily.   albuterol (VENTOLIN HFA) 108 (90 Base) MCG/ACT inhaler Inhale 2 puffs into the lungs every 6 (six) hours as needed for wheezing or shortness of breath.   Ascorbic Acid (VITAMIN C) 1000 MG tablet Take 1,000 mg by mouth daily.   cetirizine (ZYRTEC) 10 MG tablet Take 10 mg by mouth daily.   Cholecalciferol (VITAMIN D3) 30 MCG/15ML LIQD Take by mouth.   Cyanocobalamin (VITAMIN B 12 PO) Take by mouth.   ELDERBERRY PO Take by mouth.   fluticasone (FLONASE) 50 MCG/ACT nasal spray Place into both nostrils  daily.   Magnesium 250 MG TABS Take by mouth.   Multiple Vitamin (MULTIVITAMIN ADULT PO) Take by mouth.   Potassium 95 MG TABS Take by mouth. Takes 3 times a week OTC   Probiotic Product (PROBIOTIC PO) Take by mouth.   [DISCONTINUED] amoxicillin-clavulanate (AUGMENTIN) 875-125 MG tablet Take 1 tablet by mouth 2 (two) times daily. Use if no better in 1 week or gets worse   [DISCONTINUED] predniSONE (DELTASONE) 20 MG tablet 2 po at same time daily for 5 days   No facility-administered encounter medications on file as of 12/22/2021.    Review of Systems  Constitutional:  Negative for chills and fever.  HENT:  Positive for congestion, postnasal drip, rhinorrhea and sinus pressure. Negative for ear discharge, ear pain, sneezing and sore throat.   Eyes:  Negative for pain, redness and visual disturbance.  Respiratory:  Positive for cough. Negative for chest tightness and shortness of breath.   Cardiovascular:  Negative for chest pain and leg swelling.  Genitourinary:  Negative for difficulty urinating and dysuria.  Musculoskeletal:  Negative for back pain and gait problem.  Skin:  Negative for rash.  Neurological:  Negative for light-headedness and headaches.  Psychiatric/Behavioral:  Negative for agitation and behavioral problems.   All other systems reviewed and are negative.   Observations/Objective: Patient sounds good   Assessment and Plan: Problem List Items Addressed This Visit   None Visit Diagnoses     Pressure  sensation in both ears    -  Primary   Relevant Medications   amoxicillin-clavulanate (AUGMENTIN) 875-125 MG tablet   Acute recurrent maxillary sinusitis       Relevant Medications   amoxicillin-clavulanate (AUGMENTIN) 875-125 MG tablet       Will send amoxicillin and recommended mucinex and flonase and nasal saline washes and continue her antihistamine.  Call if not better or worsens Follow up plan: Return if symptoms worsen or fail to improve.     I  discussed the assessment and treatment plan with the patient. The patient was provided an opportunity to ask questions and all were answered. The patient agreed with the plan and demonstrated an understanding of the instructions.   The patient was advised to call back or seek an in-person evaluation if the symptoms worsen or if the condition fails to improve as anticipated.  The above assessment and management plan was discussed with the patient. The patient verbalized understanding of and has agreed to the management plan. Patient is aware to call the clinic if symptoms persist or worsen. Patient is aware when to return to the clinic for a follow-up visit. Patient educated on when it is appropriate to go to the emergency department.    I provided 7 minutes of non-face-to-face time during this encounter.    Worthy Rancher, MD

## 2022-01-02 ENCOUNTER — Ambulatory Visit (INDEPENDENT_AMBULATORY_CARE_PROVIDER_SITE_OTHER): Payer: Self-pay | Admitting: Family Medicine

## 2022-01-02 ENCOUNTER — Encounter: Payer: Self-pay | Admitting: Family Medicine

## 2022-01-02 VITALS — BP 132/65 | HR 90 | Temp 97.1°F | Ht 62.0 in | Wt 114.8 lb

## 2022-01-02 DIAGNOSIS — J0100 Acute maxillary sinusitis, unspecified: Secondary | ICD-10-CM

## 2022-01-02 DIAGNOSIS — H6993 Unspecified Eustachian tube disorder, bilateral: Secondary | ICD-10-CM

## 2022-01-02 MED ORDER — FLUCONAZOLE 150 MG PO TABS: 150.0000 mg | ORAL_TABLET | ORAL | 0 refills | Status: AC

## 2022-01-02 MED ORDER — CEFDINIR 300 MG PO CAPS: 300.0000 mg | ORAL_CAPSULE | Freq: Two times a day (BID) | ORAL | 0 refills | Status: DC

## 2022-01-02 NOTE — Patient Instructions (Signed)
Recommend SHORT acting pseudofed Sterile water only! Eustachian Tube Dysfunction  Eustachian tube dysfunction refers to a condition in which a blockage develops in the narrow passage that connects the middle ear to the back of the nose (eustachian tube). The eustachian tube regulates air pressure in the middle ear by letting air move between the ear and nose. It also helps to drain fluid from the middle ear space. Eustachian tube dysfunction can affect one or both ears. When the eustachian tube does not function properly, air pressure, fluid, or both can build up in the middle ear. What are the causes? This condition occurs when the eustachian tube becomes blocked or cannot open normally. Common causes of this condition include: Ear infections. Colds and other infections that affect the nose, mouth, and throat (upper respiratory tract). Allergies. Irritation from cigarette smoke. Irritation from stomach acid coming up into the esophagus (gastroesophageal reflux). The esophagus is the part of the body that moves food from the mouth to the stomach. Sudden changes in air pressure, such as from descending in an airplane or scuba diving. Abnormal growths in the nose or throat, such as: Growths that line the nose (nasal polyps). Abnormal growth of cells (tumors). Enlarged tissue at the back of the throat (adenoids). What increases the risk? You are more likely to develop this condition if: You smoke. You are overweight. You are a child who has: Certain birth defects of the mouth, such as cleft palate. Large tonsils or adenoids. What are the signs or symptoms? Common symptoms of this condition include: A feeling of fullness in the ear. Ear pain. Clicking or popping noises in the ear. Ringing in the ear (tinnitus). Hearing loss. Loss of balance. Dizziness. Symptoms may get worse when the air pressure around you changes, such as when you travel to an area of high elevation, fly on an  airplane, or go scuba diving. How is this diagnosed? This condition may be diagnosed based on: Your symptoms. A physical exam of your ears, nose, and throat. Tests, such as those that measure: The movement of your eardrum. Your hearing (audiometry). How is this treated? Treatment depends on the cause and severity of your condition. In mild cases, you may relieve your symptoms by moving air into your ears. This is called "popping the ears." In more severe cases, or if you have symptoms of fluid in your ears, treatment may include: Medicines to relieve congestion (decongestants). Medicines that treat allergies (antihistamines). Nasal sprays or ear drops that contain medicines that reduce swelling (steroids). A procedure to drain the fluid in your eardrum. In this procedure, a small tube may be placed in the eardrum to: Drain the fluid. Restore the air in the middle ear space. A procedure to insert a balloon device through the nose to inflate the opening of the eustachian tube (balloon dilation). Follow these instructions at home: Lifestyle Do not do any of the following until your health care provider approves: Travel to high altitudes. Fly in airplanes. Work in a Pension scheme manager or room. Scuba dive. Do not use any products that contain nicotine or tobacco. These products include cigarettes, chewing tobacco, and vaping devices, such as e-cigarettes. If you need help quitting, ask your health care provider. Keep your ears dry. Wear fitted earplugs during showering and bathing. Dry your ears completely after. General instructions Take over-the-counter and prescription medicines only as told by your health care provider. Use techniques to help pop your ears as recommended by your health care provider. These may  include: Chewing gum. Yawning. Frequent, forceful swallowing. Closing your mouth, holding your nose closed, and gently blowing as if you are trying to blow air out of your  nose. Keep all follow-up visits. This is important. Contact a health care provider if: Your symptoms do not go away after treatment. Your symptoms come back after treatment. You are unable to pop your ears. You have: A fever. Pain in your ear. Pain in your head or neck. Fluid draining from your ear. Your hearing suddenly changes. You become very dizzy. You lose your balance. Get help right away if: You have a sudden, severe increase in any of your symptoms. Summary Eustachian tube dysfunction refers to a condition in which a blockage develops in the eustachian tube. It can be caused by ear infections, allergies, inhaled irritants, or abnormal growths in the nose or throat. Symptoms may include ear pain or fullness, hearing loss, or ringing in the ears. Mild cases are treated with techniques to unblock the ears, such as yawning or chewing gum. More severe cases are treated with medicines or procedures. This information is not intended to replace advice given to you by your health care provider. Make sure you discuss any questions you have with your health care provider. Document Revised: 04/15/2020 Document Reviewed: 04/15/2020 Elsevier Patient Education  2023 [employer].

## 2022-01-02 NOTE — Progress Notes (Signed)
Subjective: CC: Bilateral otalgia PCP: Janora Norlander, DO [id number] Susan Baird is a 66 y.o. female presenting to clinic today for:  1.  Otalgia Patient reports bilateral ear pain, both maxillary and frontal sinus pain, particularly with bending forward.  She has been utilizing Zyrtec at nighttime, saline in her nose as well as nasal corticosteroids.  She has been doing humidification at nighttime and sinus rinses twice per day.  She admits that she does not use sterile saline for sinus rinses.  She does not report any fevers or purulence from the nares.  She is status post treatment with Augmentin and that did seem to help temporarily but symptoms just came back to a lesser degree.  No drainage but sometimes if she leans her head to the side it feels like something could drain out.  Does not report any dizziness, nausea, vomiting or fevers   ROS: Per HPI  No Known Allergies Past Medical History:  Diagnosis Date   Allergies    Kidney stone     Current Outpatient Medications:    albuterol (VENTOLIN HFA) 108 (90 Base) MCG/ACT inhaler, Inhale 2 puffs into the lungs every 6 (six) hours as needed for wheezing or shortness of breath., Disp: 8 g, Rfl: 0   Ascorbic Acid (VITAMIN C) 1000 MG tablet, Take 1,000 mg by mouth daily., Disp: , Rfl:    cetirizine (ZYRTEC) 10 MG tablet, Take 10 mg by mouth daily., Disp: , Rfl:    Cholecalciferol (VITAMIN D3) 30 MCG/15ML LIQD, Take by mouth., Disp: , Rfl:    Cyanocobalamin (VITAMIN B 12 PO), Take by mouth., Disp: , Rfl:    ELDERBERRY PO, Take by mouth., Disp: , Rfl:    fluticasone (FLONASE) 50 MCG/ACT nasal spray, Place into both nostrils daily., Disp: , Rfl:    Magnesium 250 MG TABS, Take by mouth., Disp: , Rfl:    Multiple Vitamin (MULTIVITAMIN ADULT PO), Take by mouth., Disp: , Rfl:    Potassium 95 MG TABS, Take by mouth. Takes 3 times a week OTC, Disp: , Rfl:    Probiotic Product (PROBIOTIC PO), Take by mouth., Disp: , Rfl:  Social History    Socioeconomic History   Marital status: Married    Spouse name: Not on file   Number of children: Not on file   Years of education: Not on file   Highest education level: Not on file  Occupational History   Not on file  Tobacco Use   Smoking status: Every Day    Packs/day: 1.00    Types: Cigarettes   Smokeless tobacco: Never  Vaping Use   Vaping Use: Never used  Substance and Sexual Activity   Alcohol use: No   Drug use: No   Sexual activity: Not on file  Other Topics Concern   Not on file  Social History Narrative   Not on file   Social Determinants of Health   Financial Resource Strain: Not on file  Food Insecurity: Not on file  Transportation Needs: Not on file  Physical Activity: Not on file  Stress: Not on file  Social Connections: Not on file  Intimate Partner Violence: Not on file   History reviewed. No pertinent family history.  Objective: Office vital signs reviewed. BP 132/65   Pulse 90   Temp (!) 97.1 F (36.2 C)   Ht 5\' 2"  (1.575 m)   Wt 114 lb 12.8 oz (52.1 kg)   SpO2 98%   BMI 21.00 kg/m   Physical Examination:  General: Awake, alert, well nourished, No acute distress HEENT: Normal    Neck: No masses palpated. No lymphadenopathy    Ears: Tympanic membranes intact with slight clear effusion noted behind the tympanic membrane.  Normal light reflex, no erythema, mild bulging    Eyes: PERRLA, extraocular membranes intact, sclera white    Nose: nasal turbinates moist, clear nasal discharge    Throat: moist mucus membranes, mild oropharyngeal erythema, no tonsillar exudate.  Airway is patent Cardio: regular rate   Pulm: Normal work of breathing on room air  Assessment/ Plan: 66 y.o. female   Subacute maxillary sinusitis - Plan: cefdinir (OMNICEF) 300 MG capsule, fluconazole (DIFLUCAN) 150 MG tablet  Eustachian tube disorder, bilateral  We will prickly treat with repeat antibiotics.  Omnicef sent.  Diflucan sent.  Encouraged use of sterile  saline only with sinus rinses.  We discussed OMT maneuver to alleviate eustachian tube disorder.  Consider Sudafed as needed.  Follow-up if symptoms or not resolving  No orders of the defined types were placed in this encounter.  No orders of the defined types were placed in this encounter.    Raliegh Ip, DO [facility] [phone]

## 2022-01-14 ENCOUNTER — Telehealth: Payer: Self-pay | Admitting: Family Medicine

## 2022-01-14 ENCOUNTER — Other Ambulatory Visit: Payer: Self-pay | Admitting: Family Medicine

## 2022-01-14 DIAGNOSIS — H6993 Unspecified Eustachian tube disorder, bilateral: Secondary | ICD-10-CM

## 2022-01-14 DIAGNOSIS — H938X3 Other specified disorders of ear, bilateral: Secondary | ICD-10-CM

## 2022-01-14 DIAGNOSIS — J0100 Acute maxillary sinusitis, unspecified: Secondary | ICD-10-CM

## 2022-01-14 NOTE — Telephone Encounter (Signed)
I can refer to ENT for what sounds like eustachian tube dysfunction.  Does she have a preferred location for referral?

## 2022-01-14 NOTE — Telephone Encounter (Signed)
Patient was seen on 11/17 for her ears and she said that it is still not any better. She said she has been doing the massages and has taken two antibiotics. Wants to know if there is anything else that she can try or if something else can be called in. Please call back and advise.

## 2022-01-14 NOTE — Telephone Encounter (Signed)
done

## 2022-01-14 NOTE — Telephone Encounter (Signed)
Patient prefers Susan Baird location

## 2022-02-02 ENCOUNTER — Telehealth: Payer: Self-pay | Admitting: Family Medicine

## 2022-02-03 NOTE — Telephone Encounter (Signed)
Please see Susan Baird's message

## 2022-03-13 ENCOUNTER — Telehealth (INDEPENDENT_AMBULATORY_CARE_PROVIDER_SITE_OTHER): Payer: Medicare HMO | Admitting: Family

## 2022-03-13 ENCOUNTER — Encounter: Payer: Self-pay | Admitting: Family

## 2022-03-13 DIAGNOSIS — Z87442 Personal history of urinary calculi: Secondary | ICD-10-CM | POA: Diagnosis not present

## 2022-03-13 DIAGNOSIS — R350 Frequency of micturition: Secondary | ICD-10-CM | POA: Diagnosis not present

## 2022-03-13 DIAGNOSIS — R109 Unspecified abdominal pain: Secondary | ICD-10-CM | POA: Diagnosis not present

## 2022-03-13 DIAGNOSIS — R399 Unspecified symptoms and signs involving the genitourinary system: Secondary | ICD-10-CM | POA: Diagnosis not present

## 2022-03-13 DIAGNOSIS — R319 Hematuria, unspecified: Secondary | ICD-10-CM | POA: Diagnosis not present

## 2022-03-13 LAB — URINALYSIS, COMPLETE
Bilirubin, UA: NEGATIVE
Glucose, UA: NEGATIVE
Ketones, UA: NEGATIVE
Leukocytes,UA: NEGATIVE
Nitrite, UA: NEGATIVE
Protein,UA: NEGATIVE
Specific Gravity, UA: 1.01 (ref 1.005–1.030)
Urobilinogen, Ur: 0.2 mg/dL (ref 0.2–1.0)
pH, UA: 6 (ref 5.0–7.5)

## 2022-03-13 LAB — MICROSCOPIC EXAMINATION
Bacteria, UA: NONE SEEN
Renal Epithel, UA: NONE SEEN /hpf

## 2022-03-13 NOTE — Progress Notes (Signed)
Virtual Visit Consent   Susan Baird, you are scheduled for a virtual visit with a [facility] provider today. Just as with appointments in the office, your consent must be obtained to participate. Your consent will be active for this visit and any virtual visit you may have with one of our providers in the next 365 days. If you have a MyChart account, a copy of this consent can be sent to you electronically.  As this is a virtual visit, video technology does not allow for your provider to perform a traditional examination. This may limit your provider's ability to fully assess your condition. If your provider identifies any concerns that need to be evaluated in person or the need to arrange testing (such as labs, EKG, etc.), we will make arrangements to do so. Although advances in technology are sophisticated, we cannot ensure that it will always work on either your end or our end. If the connection with a video visit is poor, the visit may have to be switched to a telephone visit. With either a video or telephone visit, we are not always able to ensure that we have a secure connection.  By engaging in this virtual visit, you consent to the provision of healthcare and authorize for your insurance to be billed (if applicable) for the services provided during this visit. Depending on your insurance coverage, you may receive a charge related to this service.  I need to obtain your verbal consent now. Are you willing to proceed with your visit today? Susan Baird has provided verbal consent on 03/13/2022 for a virtual visit (video or telephone). Susan Dun, FNP  Date: 03/13/2022 1:17 PM  Virtual Visit via Video Note   I, Susan Baird, connected with  Susan Baird  ([id number], 02-21-1955) on 03/13/22 at  4:30 PM EST by a video-enabled telemedicine application and verified that I am speaking with the correct person using two identifiers.  Location: Patient: Virtual Visit Location Patient:  Home Provider: Virtual Visit Location Provider: Home Office   I discussed the limitations of evaluation and management by telemedicine and the availability of in person appointments. The patient expressed understanding and agreed to proceed.    History of Present Illness: Susan Baird is a 67 y.o. who identifies as a female who was assigned female at birth, and is being seen today for abdominal pressure and urinary frequency for greater than a month.   HPI: Urinary Frequency  This is a new problem. The current episode started more than 1 month ago. The problem occurs intermittently. The problem has been waxing and waning. Quality: pressure. The pain is at a severity of 4/10. There has been no fever. Associated symptoms include frequency, nausea and urgency. Pertinent negatives include no hematuria or vomiting. She has tried increased fluids for the symptoms. The treatment provided mild relief.    Problems:  Patient Active Problem List   Diagnosis Date Noted   Serous otitis media 07/08/2015   Tobacco abuse 05/30/2015   Seasonal allergies 05/30/2015    Allergies: No Known Allergies Medications:  Current Outpatient Medications:    albuterol (VENTOLIN HFA) 108 (90 Base) MCG/ACT inhaler, Inhale 2 puffs into the lungs every 6 (six) hours as needed for wheezing or shortness of breath., Disp: 8 g, Rfl: 0   Ascorbic Acid (VITAMIN C) 1000 MG tablet, Take 1,000 mg by mouth daily., Disp: , Rfl:    cetirizine (ZYRTEC) 10 MG tablet, Take 10 mg by mouth daily., Disp: , Rfl:  Cholecalciferol (VITAMIN D3) 30 MCG/15ML LIQD, Take by mouth., Disp: , Rfl:    Cyanocobalamin (VITAMIN B 12 PO), Take by mouth., Disp: , Rfl:    ELDERBERRY PO, Take by mouth., Disp: , Rfl:    fluticasone (FLONASE) 50 MCG/ACT nasal spray, Place into both nostrils daily., Disp: , Rfl:    Magnesium 250 MG TABS, Take by mouth., Disp: , Rfl:    Multiple Vitamin (MULTIVITAMIN ADULT PO), Take by mouth., Disp: , Rfl:    Potassium 95 MG  TABS, Take by mouth. Takes 3 times a week OTC, Disp: , Rfl:    Probiotic Product (PROBIOTIC PO), Take by mouth., Disp: , Rfl:   Observations/Objective: Patient is well-developed, well-nourished in no acute distress.  Resting comfortably  at home.  Head is normocephalic, atraumatic.  No labored breathing.  Speech is clear and coherent with logical content.  Patient is alert and oriented at baseline.    Assessment and Plan: 1. Abdominal pressure - Urinalysis, Complete - Urine Culture - Ambulatory referral to Urology  2. Urinary frequency - Ambulatory referral to Urology  3. UTI symptoms  4. History of kidney stones  Force fluids Given  hematuria and hx of kidney stones, possible stone? Make sure force fluids Urine culture pending, urine negative for infection  Referral to Urologists placed given UTI symptoms > month  Follow Up Instructions: I discussed the assessment and treatment plan with the patient. The patient was provided an opportunity to ask questions and all were answered. The patient agreed with the plan and demonstrated an understanding of the instructions.  A copy of instructions were sent to the patient via MyChart unless otherwise noted below.     The patient was advised to call back or seek an in-person evaluation if the symptoms worsen or if the condition fails to improve as anticipated.  Time:  I spent 11 minutes with the patient via telehealth technology discussing the above problems/concerns.    Susan Dun, FNP

## 2022-03-15 LAB — URINE CULTURE

## 2022-03-19 ENCOUNTER — Ambulatory Visit: Payer: Medicare HMO | Admitting: Urology

## 2022-03-19 ENCOUNTER — Ambulatory Visit (HOSPITAL_COMMUNITY)
Admission: RE | Admit: 2022-03-19 | Discharge: 2022-03-19 | Disposition: A | Discharge status: Home / Self Care | Payer: Medicare HMO | Source: Ambulatory Visit | Attending: Urology | Admitting: Urology

## 2022-03-19 ENCOUNTER — Encounter: Payer: Self-pay | Admitting: Urology

## 2022-03-19 VITALS — BP 129/80 | HR 118 | Ht 62.0 in | Wt 112.0 lb

## 2022-03-19 DIAGNOSIS — N200 Calculus of kidney: Secondary | ICD-10-CM | POA: Diagnosis not present

## 2022-03-19 DIAGNOSIS — N3289 Other specified disorders of bladder: Secondary | ICD-10-CM | POA: Diagnosis not present

## 2022-03-19 DIAGNOSIS — R3129 Other microscopic hematuria: Secondary | ICD-10-CM

## 2022-03-19 DIAGNOSIS — R109 Unspecified abdominal pain: Secondary | ICD-10-CM | POA: Insufficient documentation

## 2022-03-19 DIAGNOSIS — R350 Frequency of micturition: Secondary | ICD-10-CM

## 2022-03-19 DIAGNOSIS — K5730 Diverticulosis of large intestine without perforation or abscess without bleeding: Secondary | ICD-10-CM | POA: Diagnosis not present

## 2022-03-19 DIAGNOSIS — I700 Atherosclerosis of aorta: Secondary | ICD-10-CM | POA: Diagnosis not present

## 2022-03-19 LAB — BLADDER SCAN AMB NON-IMAGING: Scan Result: 0

## 2022-03-19 NOTE — Progress Notes (Signed)
Subjective: 1. Urinary frequency   2. Microhematuria   3. Flank pain   4. Nephrolithiasis      Consult requested by Evelina Dun FNP  Susan Baird is a 67 yo female who has been seen in the [place] office in the past for a stones.  She is sent in consultation today for abdominal discomfort with frequency and hematuria with 2+ blood on a dip UA on 03/13/22 but only 0-2 RBC's on micro.  Her last CT in 2016 showed a small RLP stone. In October she was treated for a UTI with a couple of rounds of antibiotics but after that she began to have some pelvic and lower abdominal pain.   She then started having lower back and flank pain on the right.  She has urgency with UUI if not careful.  She has no increased frequency or nocturia.  Her PVR is 46ml. Her UA has 11-30 RBC's.  She has had mild nausea x 2 in the last 2 months.     ROS:  Review of Systems  HENT:  Positive for congestion.   Musculoskeletal:  Positive for back pain.  All other systems reviewed and are negative.   No Known Allergies  Past Medical History:  Diagnosis Date   Allergies    Kidney stone     Past Surgical History:  Procedure Laterality Date   CESAREAN SECTION     CHOLECYSTECTOMY N/A 04/05/2019   Procedure: LAPAROSCOPIC CHOLECYSTECTOMY WITH INTRAOPERATIVE CHOLANGIOGRAM;  Surgeon: Donnie Mesa, MD;  Location: [facility];  Service: General;  Laterality: N/A;   TUBAL LIGATION      Social History   Socioeconomic History   Marital status: Married    Spouse name: Not on file   Number of children: Not on file   Years of education: Not on file   Highest education level: Not on file  Occupational History   Not on file  Tobacco Use   Smoking status: Every Day    Packs/day: 1.00    Types: Cigarettes   Smokeless tobacco: Never  Vaping Use   Vaping Use: Never used  Substance and Sexual Activity   Alcohol use: No   Drug use: No   Sexual activity: Not on file  Other Topics Concern   Not on file   Social History Narrative   Not on file   Social Determinants of Health   Financial Resource Strain: Not on file  Food Insecurity: Not on file  Transportation Needs: Not on file  Physical Activity: Not on file  Stress: Not on file  Social Connections: Not on file  Intimate Partner Violence: Not on file    History reviewed. No pertinent family history.  Anti-infectives: Anti-infectives (From admission, onward)    None       Current Outpatient Medications  Medication Sig Dispense Refill   albuterol (VENTOLIN HFA) 108 (90 Base) MCG/ACT inhaler Inhale 2 puffs into the lungs every 6 (six) hours as needed for wheezing or shortness of breath. 8 g 0   Ascorbic Acid (VITAMIN C) 1000 MG tablet Take 1,000 mg by mouth daily.     cetirizine (ZYRTEC) 10 MG tablet Take 10 mg by mouth daily.     Cholecalciferol (VITAMIN D3) 30 MCG/15ML LIQD Take by mouth.     Cyanocobalamin (VITAMIN B 12 PO) Take by mouth.     ELDERBERRY PO Take by mouth.     fluticasone (FLONASE) 50 MCG/ACT nasal spray Place into both nostrils daily.  Magnesium 250 MG TABS Take by mouth.     Multiple Vitamin (MULTIVITAMIN ADULT PO) Take by mouth.     Potassium 95 MG TABS Take by mouth. Takes 3 times a week OTC     Probiotic Product (PROBIOTIC PO) Take by mouth.     No current facility-administered medications for this visit.     Objective: Vital signs in last 24 hours: BP 129/80   Pulse (!) 118   Ht 5\' 2"  (1.575 m)   Wt 112 lb (50.8 kg)   BMI 20.49 kg/m   Intake/Output from previous day: No intake/output data recorded. Intake/Output this shift: @IOTHISSHIFT @   Physical Exam Vitals reviewed.  Constitutional:      Appearance: Normal appearance.  Cardiovascular:     Rate and Rhythm: Normal rate and regular rhythm.     Heart sounds: Normal heart sounds.  Pulmonary:     Effort: Pulmonary effort is normal. No respiratory distress.     Breath sounds: Normal breath sounds.  Abdominal:     General:  Abdomen is flat.     Palpations: Abdomen is soft.     Tenderness: There is abdominal tenderness (in the lower abdomen on the right.). There is right CVA tenderness.  Musculoskeletal:        General: No swelling or tenderness. Normal range of motion.  Skin:    General: Skin is warm and dry.  Neurological:     General: No focal deficit present.     Mental Status: She is alert and oriented to person, place, and time.  Psychiatric:        Mood and Affect: Mood normal.        Behavior: Behavior normal.     Lab Results:  No results found for this or any previous visit (from the past 24 hour(s)).  BMET No results for input(s): "NA", "K", "CL", "CO2", "GLUCOSE", "BUN", "CREATININE", "CALCIUM" in the last 72 hours. PT/INR No results for input(s): "LABPROT", "INR" in the last 72 hours. ABG No results for input(s): "PHART", "HCO3" in the last 72 hours.  Invalid input(s): "PCO2", "PO2" UA has 11-30 RBC's.  Studies/Results: No results found. I have reviewed her prior CT and records from [place].   Assessment/Plan: Microhematuria with flank pain, urgency and renal stones.   I will get a CT stone study today and contact her with the results.    No orders of the defined types were placed in this encounter.    Orders Placed This Encounter  Procedures   CT RENAL STONE STUDY    Standing Status:   Future    Standing Expiration Date:   09/17/2022    Order Specific Question:   Preferred imaging location?    Answer:   [facility]    Order Specific Question:   Radiology Contrast Protocol - do NOT remove file path    Answer:   [email or web address]   Urinalysis, Routine w reflex microscopic   Basic metabolic panel   BLADDER SCAN AMB NON-IMAGING     Return for I will contact her with the CT results. .    CC: Evelina Dun FNP.     Irine Seal 03/19/2022

## 2022-03-19 NOTE — Progress Notes (Signed)
post void residual=0 ?

## 2022-03-20 ENCOUNTER — Telehealth: Payer: Self-pay

## 2022-03-20 LAB — URINALYSIS, ROUTINE W REFLEX MICROSCOPIC
Bilirubin, UA: NEGATIVE
Glucose, UA: NEGATIVE
Ketones, UA: NEGATIVE
Leukocytes,UA: NEGATIVE
Nitrite, UA: NEGATIVE
Protein,UA: NEGATIVE
Specific Gravity, UA: 1.03 (ref 1.005–1.030)
Urobilinogen, Ur: 0.2 mg/dL (ref 0.2–1.0)
pH, UA: 6 (ref 5.0–7.5)

## 2022-03-20 LAB — BASIC METABOLIC PANEL
BUN/Creatinine Ratio: 30 — ABNORMAL HIGH (ref 12–28)
BUN: 21 mg/dL (ref 8–27)
CO2: 28 mmol/L (ref 20–29)
Calcium: 10 mg/dL (ref 8.7–10.3)
Chloride: 100 mmol/L (ref 96–106)
Creatinine, Ser: 0.7 mg/dL (ref 0.57–1.00)
Glucose: 99 mg/dL (ref 70–99)
Potassium: 4.7 mmol/L (ref 3.5–5.2)
Sodium: 143 mmol/L (ref 134–144)
eGFR: 96 mL/min/{1.73_m2} (ref >59)

## 2022-03-20 LAB — MICROSCOPIC EXAMINATION

## 2022-03-20 NOTE — Telephone Encounter (Signed)
Patient is aware the CT shows a stone in the right kidney that is a little larger than on her prior CT but it is no obstructing or contributing to her symptoms. Her bladder wall is a little thickened which can be from inflammation but I need to get her back next week if possible for cystoscopy  and patient is scheduled for cysto.Patient voiced understanding

## 2022-03-20 NOTE — Telephone Encounter (Signed)
-----   Message from Irine Seal, MD sent at 03/20/2022  9:02 AM EST ----- The CT shows a stone in the right kidney that is a little larger than on her prior CT but it is no obstructing or contributing to her symptoms.   Her bladder wall is a little thickened which can be from inflammation but I need to get her back next week if possible for cystoscopy and it is ok to double book.    ----- Message ----- From: Sherrilyn Rist, CMA Sent: 03/20/2022   8:28 AM EST To: Irine Seal, MD  Please review

## 2022-03-20 NOTE — Telephone Encounter (Signed)
-----   Message from Irine Seal, MD sent at 03/20/2022  9:04 AM EST ----- Her chemistries look good.  Nothing of concern.  The BUN is a little up, but she probably just needs to drink more water.  ----- Message ----- From: Sherrilyn Rist, CMA Sent: 03/20/2022   8:25 AM EST To: Irine Seal, MD  Please review

## 2022-03-26 ENCOUNTER — Encounter: Payer: Self-pay | Admitting: Urology

## 2022-03-26 ENCOUNTER — Ambulatory Visit (INDEPENDENT_AMBULATORY_CARE_PROVIDER_SITE_OTHER): Payer: Medicare HMO | Admitting: Urology

## 2022-03-26 VITALS — BP 136/64 | HR 92 | Ht 62.0 in | Wt 112.0 lb

## 2022-03-26 DIAGNOSIS — R102 Pelvic and perineal pain: Secondary | ICD-10-CM

## 2022-03-26 DIAGNOSIS — N3941 Urge incontinence: Secondary | ICD-10-CM

## 2022-03-26 DIAGNOSIS — R3129 Other microscopic hematuria: Secondary | ICD-10-CM | POA: Diagnosis not present

## 2022-03-26 DIAGNOSIS — R350 Frequency of micturition: Secondary | ICD-10-CM

## 2022-03-26 DIAGNOSIS — N200 Calculus of kidney: Secondary | ICD-10-CM

## 2022-03-26 LAB — URINALYSIS, ROUTINE W REFLEX MICROSCOPIC
Bilirubin, UA: NEGATIVE
Glucose, UA: NEGATIVE
Ketones, UA: NEGATIVE
Leukocytes,UA: NEGATIVE
Nitrite, UA: NEGATIVE
Protein,UA: NEGATIVE
Specific Gravity, UA: 1.015 (ref 1.005–1.030)
Urobilinogen, Ur: 0.2 mg/dL (ref 0.2–1.0)
pH, UA: 7 (ref 5.0–7.5)

## 2022-03-26 LAB — MICROSCOPIC EXAMINATION: Bacteria, UA: NONE SEEN

## 2022-03-26 MED ORDER — CIPROFLOXACIN HCL 500 MG PO TABS: 500.0000 mg | ORAL_TABLET | Freq: Once | ORAL | Status: DC

## 2022-03-26 MED ORDER — MIRABEGRON ER 50 MG PO TB24: 50.0000 mg | ORAL_TABLET | Freq: Every day | ORAL | 0 refills | Status: DC

## 2022-03-26 MED ORDER — MIRABEGRON ER 25 MG PO TB24: 25.0000 mg | ORAL_TABLET | Freq: Every day | ORAL | 0 refills | Status: DC

## 2022-03-26 NOTE — Progress Notes (Signed)
Subjective: 1. Microhematuria   2. Nephrolithiasis   3. Urinary frequency   4. Urge incontinence   5. Pelvic pain in female      03/26/22: Susan Baird returns today for cystoscopy.  Her CT showed a 4 non-obstructing RLP renal stone that was 1m on prior CT and some bladder wall thickening.  She has diverticulosis without inflammation. She is for cystoscopy today.   She still has some urgency and frequency with UUI and some pelvic and low back pain.  UA has 3-10 RBC's.   03/19/22: Susan Baird a 67yo [place] [facility] [place].  She is sent in consultation today for abdominal discomfort with frequency and hematuria with 2+ blood on a dip UA on 03/13/22 but only 0-2 RBC's on micro.  Her last CT in 2016 showed a small RLP stone. In October she was treated for a UTI with a couple of rounds of antibiotics but after that she began to have some pelvic and lower abdominal pain.   She then started having lower back and flank pain on the right.  She has urgency with UUI if not careful.  She has no increased frequency or nocturia.  Her PVR is 037m Her UA has 11-30 RBC's.  She has had mild nausea x 2 in the last 2 months.     ROS:  ROS  No Known Allergies  Past Medical History:  Diagnosis Date   Allergies    Kidney stone     Past Surgical History:  Procedure Laterality Date   CESAREAN SECTION     CHOLECYSTECTOMY N/A 04/05/2019   Procedure: LAPAROSCOPIC CHOLECYSTECTOMY WITH INTRAOPERATIVE CHOLANGIOGRAM;  Surgeon: TsDonnie MesaMD;  [place] [facility] Service: General;  Laterality: N/A;   TUBAL LIGATION      Social History   Socioeconomic History   Marital status: Married    Spouse name: Not on file   Number of children: Not on file   Years of education: Not on file   Highest education level: Not on file  Occupational History   Not on file  Tobacco Use   Smoking status: Every Day    Packs/day: 1.00    Types: Cigarettes    Smokeless tobacco: Never  Vaping Use   Vaping Use: Never used  Substance and Sexual Activity   Alcohol use: No   Drug use: No   Sexual activity: Not on file  Other Topics Concern   Not on file  Social History Narrative   Not on file   Social Determinants of Health   Financial Resource Strain: Not on file  Food Insecurity: Not on file  Transportation Needs: Not on file  Physical Activity: Not on file  Stress: Not on file  Social Connections: Not on file  Intimate Partner Violence: Not on file    History reviewed. No pertinent family history.  Anti-infectives: Anti-infectives (From admission, onward)    Start     Dose/Rate Route Frequency Ordered Stop   03/26/22 1000  CIPROFLOXACIN HCL 500 MG PO TABS        500 mg Oral  Once 03/26/22 0945         Current Outpatient Medications  Medication Sig Dispense Refill   albuterol (VENTOLIN HFA) 108 (90 Base) MCG/ACT inhaler Inhale 2 puffs into the lungs every 6 (six) hours as needed for wheezing or shortness of breath. 8 g 0   Ascorbic Acid (VITAMIN C) 1000 MG  tablet Take 1,000 mg by mouth daily.     cetirizine (ZYRTEC) 10 MG tablet Take 10 mg by mouth daily.     Cholecalciferol (VITAMIN D3) 30 MCG/15ML LIQD Take by mouth.     Cyanocobalamin (VITAMIN B 12 PO) Take by mouth.     ELDERBERRY PO Take by mouth.     fluticasone (FLONASE) 50 MCG/ACT nasal spray Place into both nostrils daily.     Magnesium 250 MG TABS Take by mouth.     mirabegron ER (MYRBETRIQ) 25 MG TB24 tablet Take 1 tablet (25 mg total) by mouth daily. 28 tablet 0   mirabegron ER (MYRBETRIQ) 50 MG TB24 tablet Take 1 tablet (50 mg total) by mouth daily. 28 tablet 0   Multiple Vitamin (MULTIVITAMIN ADULT PO) Take by mouth.     Potassium 95 MG TABS Take by mouth. Takes 3 times a week OTC     Probiotic Product (PROBIOTIC PO) Take by mouth.     Current Facility-Administered Medications  Medication Dose Route Frequency Provider Last Rate Last Admin   ciprofloxacin  (CIPRO) tablet 500 mg  500 mg Oral Once Irine Seal, MD         Objective: Vital signs in last 24 hours: BP 136/64   Pulse 92   Ht 5' 2"$  (1.575 m)   Wt 112 lb (50.8 kg)   BMI 20.49 kg/m   Intake/Output from previous day: No intake/output data recorded. Intake/Output this shift: @IOTHISSHIFT$ @   Physical Exam  Lab Results:  Results for orders placed or performed in visit on 03/26/22 (from the past 24 hour(s))  Urinalysis, Routine w reflex microscopic     Status: Abnormal   Collection Time: 03/26/22  9:49 AM  Result Value Ref Range   Specific Gravity, UA 1.015 1.005 - 1.030   pH, UA 7.0 5.0 - 7.5   Color, UA Yellow Yellow   Appearance Ur Clear Clear   Leukocytes,UA Negative Negative   Protein,UA Negative Negative/Trace   Glucose, UA Negative Negative   Ketones, UA Negative Negative   RBC, UA 1+ (A) Negative   Bilirubin, UA Negative Negative   Urobilinogen, Ur 0.2 0.2 - 1.0 mg/dL   Nitrite, UA Negative Negative   Microscopic Examination See below:    Narrative   Performed at:  [facility] [address] [place], [place]  [id number] Lab Director: Mina Marble MT, Phone:  [phone]  Microscopic Examination     Status: Abnormal   Collection Time: 03/26/22  9:49 AM   Urine  Result Value Ref Range   WBC, UA 0-5 0 - 5 /hpf   RBC, Urine 3-10 (A) 0 - 2 /hpf   Epithelial Cells (non renal) 0-10 0 - 10 /hpf   Bacteria, UA None seen None seen/Few   Narrative   Performed at:  [facility], [place], [place]  [id number] Lab Director: [place], Phone:  [phone]    BMET No results for input(s): "NA", "K", "CL", "CO2", "GLUCOSE", "BUN", "CREATININE", "CALCIUM" in the last 72 hours. PT/INR No results for input(s): "LABPROT", "INR" in the last 72 hours. ABG No results for input(s): "PHART", "HCO3" in the last 72 hours.  Invalid input(s): "PCO2", "PO2" UA on 03/19/22 had 11-30 RBC's.  UA on 03/26/22 has 3-10 RBC's.   Studies/Results: No results found. I have reviewed her prior CT and records from [place].  CT RENAL STONE STUDY  Result Date: 03/19/2022 CLINICAL DATA:  Symptomatic microhematuria with right flank  and abdominal pain, known right renal stone EXAM: CT ABDOMEN AND PELVIS WITHOUT CONTRAST TECHNIQUE: Multidetector CT imaging of the abdomen and pelvis was performed following the standard protocol without IV contrast. RADIATION DOSE REDUCTION: This exam was performed according to the departmental dose-optimization program which includes automated exposure control, adjustment of the mA and/or kV according to patient size and/or use of iterative reconstruction technique. COMPARISON:  04/13/2014 FINDINGS: Lower chest: No acute abnormality. Hepatobiliary: No focal liver abnormality is seen. Status post cholecystectomy. No biliary dilatation. Pancreas: Unremarkable. No pancreatic ductal dilatation or surrounding inflammatory changes. Spleen: Normal in size without significant abnormality. Adrenals/Urinary Tract: Adrenal glands are unremarkable. Nonobstructive calculus of the midportion of the right kidney measuring 0.4 cm. No ureteral calculi or hydronephrosis. The left kidney is normal, without renal calculi, obvious solid lesion, or hydronephrosis. Mild thickening of the urinary bladder wall. Stomach/Bowel: Stomach is within normal limits. Appendix appears normal. No evidence of bowel wall thickening, distention, or inflammatory changes. Sigmoid diverticula. Vascular/Lymphatic: Aortic atherosclerosis. No enlarged abdominal or pelvic lymph nodes. Reproductive: No mass or other significant abnormality. Other: No abdominal wall hernia or abnormality. No ascites. Musculoskeletal: No acute or significant osseous findings. IMPRESSION: 1. Small nonobstructive calculus of the midportion of the right kidney. No ureteral calculi or hydronephrosis. 2. Mild thickening of the urinary bladder wall, suggestive of nonspecific infectious or  inflammatory cystitis. Correlate with urinalysis. 3. Sigmoid diverticulosis without evidence of acute diverticulitis. Aortic Atherosclerosis (ICD10-I70.0). Electronically Signed   By: Delanna Ahmadi M.D.   On: 03/19/2022 16:56     Procedure:  Cystoscopy.  She was prepped with betadine and given po Cipro.  The scope was easily passed.  The urethra is normal.  There is mild trabeculation without tumors, stones or inflammation.  UO's are normal.   Assessment/Plan: Microhematuria with urgency, frequency and UUI.  I will get a cytology and try her on myrbetriq.  I have given her 85m and 562msamples with instructions and a review of the side effects.  She will start with the 2547mor at least 2 weeks.   Pelvic pain.  I have recommended aleve twice daily for 2 weeks to see if that helps.  She might benefit from PT but wants to hold off on that.   Right renal stone.  This stone has grown over time but remains passable.    Meds ordered this encounter  Medications   ciprofloxacin (CIPRO) tablet 500 mg   mirabegron ER (MYRBETRIQ) 25 MG TB24 tablet    Sig: Take 1 tablet (25 mg total) by mouth daily.    Dispense:  28 tablet    Refill:  0   mirabegron ER (MYRBETRIQ) 50 MG TB24 tablet    Sig: Take 1 tablet (50 mg total) by mouth daily.    Dispense:  28 tablet    Refill:  0      Orders Placed This Encounter  Procedures   Microscopic Examination   Urinalysis, Routine w reflex microscopic     Return in about 6 weeks (around 05/07/2022).    CC: ChrEvelina DunP.     JohIrine Seal9/2024

## 2022-04-16 DIAGNOSIS — H9312 Tinnitus, left ear: Secondary | ICD-10-CM | POA: Diagnosis not present

## 2022-04-16 DIAGNOSIS — H6993 Unspecified Eustachian tube disorder, bilateral: Secondary | ICD-10-CM | POA: Diagnosis not present

## 2022-05-07 ENCOUNTER — Ambulatory Visit: Payer: Medicare HMO | Admitting: Urology

## 2022-05-07 VITALS — BP 132/74 | HR 84 | Ht 62.0 in | Wt 112.0 lb

## 2022-05-07 DIAGNOSIS — R3129 Other microscopic hematuria: Secondary | ICD-10-CM

## 2022-05-07 DIAGNOSIS — N3941 Urge incontinence: Secondary | ICD-10-CM | POA: Diagnosis not present

## 2022-05-07 DIAGNOSIS — R102 Pelvic and perineal pain: Secondary | ICD-10-CM

## 2022-05-07 DIAGNOSIS — R350 Frequency of micturition: Secondary | ICD-10-CM | POA: Diagnosis not present

## 2022-05-07 DIAGNOSIS — N200 Calculus of kidney: Secondary | ICD-10-CM | POA: Diagnosis not present

## 2022-05-07 LAB — URINALYSIS, ROUTINE W REFLEX MICROSCOPIC
Bilirubin, UA: NEGATIVE
Glucose, UA: NEGATIVE
Ketones, UA: NEGATIVE
Leukocytes,UA: NEGATIVE
Nitrite, UA: NEGATIVE
Protein,UA: NEGATIVE
Specific Gravity, UA: 1.01 (ref 1.005–1.030)
Urobilinogen, Ur: 0.2 mg/dL (ref 0.2–1.0)
pH, UA: 6 (ref 5.0–7.5)

## 2022-05-07 LAB — MICROSCOPIC EXAMINATION: Bacteria, UA: NONE SEEN

## 2022-05-07 MED ORDER — MIRABEGRON ER 25 MG PO TB24: 25.0000 mg | ORAL_TABLET | Freq: Every day | ORAL | 11 refills | Status: DC

## 2022-05-07 NOTE — Progress Notes (Signed)
Subjective: 1. Urge incontinence   2. Urinary frequency   3. Pelvic pain in female   4. Microhematuria   5. Nephrolithiasis     05/07/22: Susan Baird returns today in f/u to assess her response to [facility] for her OAB with UUI.   She also was to use an OTC NSAID for the pelvic pain.  She didn't get a cytology at her last visit and I will consider that today.  Her cystoscopy for the microhematuria was negative.  She had small stones on CT.   She had a response to Myrbetriq 25mg .  She has had no nausea or HA but did have a little transient abdominal pain.  She has reduced frequency and she can hold it better.   She has 3-10 RBC's todays.    2/8/24[name] returns today for cystoscopy.  Her CT showed a 4 non-obstructing RLP renal stone that was 47mm on prior CT and some bladder wall thickening.  She has diverticulosis without inflammation. She is for cystoscopy today.   She still has some urgency and frequency with UUI and some pelvic and low back pain.  UA has 3-10 RBC's.   03/19/22: Susan Baird is a 67 yo female who has been seen in the [place] office in the past for a stones.  She is sent in consultation today for abdominal discomfort with frequency and hematuria with 2+ blood on a dip UA on 03/13/22 but only 0-2 RBC's on micro.  Her last CT in 2016 showed a small RLP stone. In October she was treated for a UTI with a couple of rounds of antibiotics but after that she began to have some pelvic and lower abdominal pain.   She then started having lower back and flank pain on the right.  She has urgency with UUI if not careful.  She has no increased frequency or nocturia.  Her PVR is 68ml. Her UA has 11-30 RBC's.  She has had mild nausea x 2 in the last 2 months.     ROS:  Review of Systems  HENT:  Positive for congestion.     No Known Allergies  Past Medical History:  Diagnosis Date   Allergies    Kidney stone     Past Surgical History:  Procedure Laterality Date   CESAREAN SECTION      CHOLECYSTECTOMY N/A 04/05/2019   Procedure: LAPAROSCOPIC CHOLECYSTECTOMY WITH INTRAOPERATIVE CHOLANGIOGRAM;  Surgeon: Donnie Mesa, MD;  Location: [facility];  Service: General;  Laterality: N/A;   TUBAL LIGATION      Social History   Socioeconomic History   Marital status: Married    Spouse name: Not on file   Number of children: Not on file   Years of education: Not on file   Highest education level: Not on file  Occupational History   Not on file  Tobacco Use   Smoking status: Every Day    Packs/day: 1    Types: Cigarettes   Smokeless tobacco: Never  Vaping Use   Vaping Use: Never used  Substance and Sexual Activity   Alcohol use: No   Drug use: No   Sexual activity: Not on file  Other Topics Concern   Not on file  Social History Narrative   Not on file   Social Determinants of Health   Financial Resource Strain: Not on file  Food Insecurity: Not on file  Transportation Needs: Not on file  Physical Activity: Not on file  Stress: Not on file  Social Connections:  Not on file  Intimate Partner Violence: Not on file    No family history on file.  Anti-infectives: Anti-infectives (From admission, onward)    None       Current Outpatient Medications  Medication Sig Dispense Refill   albuterol (VENTOLIN HFA) 108 (90 Base) MCG/ACT inhaler Inhale 2 puffs into the lungs every 6 (six) hours as needed for wheezing or shortness of breath. 8 g 0   Ascorbic Acid (VITAMIN C) 1000 MG tablet Take 1,000 mg by mouth daily.     cetirizine (ZYRTEC) 10 MG tablet Take 10 mg by mouth daily.     Cholecalciferol (VITAMIN D3) 30 MCG/15ML LIQD Take by mouth.     Cyanocobalamin (VITAMIN B 12 PO) Take by mouth.     ELDERBERRY PO Take by mouth.     fluticasone (FLONASE) 50 MCG/ACT nasal spray Place into both nostrils daily.     Magnesium 250 MG TABS Take by mouth.     Multiple Vitamin (MULTIVITAMIN ADULT PO) Take by mouth.     Potassium 95 MG TABS Take by mouth.  Takes 3 times a week OTC     Probiotic Product (PROBIOTIC PO) Take by mouth.     mirabegron ER (MYRBETRIQ) 25 MG TB24 tablet Take 1 tablet (25 mg total) by mouth daily. 30 tablet 11   Current Facility-Administered Medications  Medication Dose Route Frequency Provider Last Rate Last Admin   ciprofloxacin (CIPRO) tablet 500 mg  500 mg Oral Once Irine Seal, MD         Objective: Vital signs in last 24 hours: BP 132/74   Pulse 84   Ht 5\' 2"  (1.575 m)   Wt 112 lb (50.8 kg)   BMI 20.49 kg/m   Intake/Output from previous day: No intake/output data recorded. Intake/Output this shift: @IOTHISSHIFT @   Physical Exam Vitals reviewed.  Constitutional:      Appearance: Normal appearance.  Neurological:     Mental Status: She is alert.     Lab Results:  Results for orders placed or performed in visit on 05/07/22 (from the past 24 hour(s))  Urinalysis, Routine w reflex microscopic     Status: Abnormal   Collection Time: 05/07/22 10:19 AM  Result Value Ref Range   Specific Gravity, UA 1.010 1.005 - 1.030   pH, UA 6.0 5.0 - 7.5   Color, UA Yellow Yellow   Appearance Ur Clear Clear   Leukocytes,UA Negative Negative   Protein,UA Negative Negative/Trace   Glucose, UA Negative Negative   Ketones, UA Negative Negative   RBC, UA 1+ (A) Negative   Bilirubin, UA Negative Negative   Urobilinogen, Ur 0.2 0.2 - 1.0 mg/dL   Nitrite, UA Negative Negative   Microscopic Examination See below:    Narrative   Performed at:  [facility] [address] [place], [place]  [id number] Lab Director: Mina Marble MT, Phone:  [phone]  Microscopic Examination     Status: Abnormal   Collection Time: 05/07/22 10:19 AM   Urine  Result Value Ref Range   WBC, UA 0-5 0 - 5 /hpf   RBC, Urine 3-10 (A) 0 - 2 /hpf   Epithelial Cells (non renal) 0-10 0 - 10 /hpf   Bacteria, UA None seen None seen/Few   Narrative   Performed at:  [facility] [address],  [place], [place]  [id number] Lab Director: [place], Phone:  [phone]     BMET No results for input(s): "NA", "K", "  CL", "CO2", "GLUCOSE", "BUN", "CREATININE", "CALCIUM" in the last 72 hours. PT/INR No results for input(s): "LABPROT", "INR" in the last 72 hours. ABG No results for input(s): "PHART", "HCO3" in the last 72 hours.  Invalid input(s): "PCO2", "PO2" UA on 03/19/22 had 11-30 RBC's.  UA on 03/26/22 has 3-10 RBC's.  UA on 05/07/22 has 3-10 RBC's.  Studies/Results: No results found.  CT RENAL STONE STUDY  Result Date: 03/19/2022 CLINICAL DATA:  Symptomatic microhematuria with right flank and abdominal pain, known right renal stone EXAM: CT ABDOMEN AND PELVIS WITHOUT CONTRAST TECHNIQUE: Multidetector CT imaging of the abdomen and pelvis was performed following the standard protocol without IV contrast. RADIATION DOSE REDUCTION: This exam was performed according to the departmental dose-optimization program which includes automated exposure control, adjustment of the mA and/or kV according to patient size and/or use of iterative reconstruction technique. COMPARISON:  04/13/2014 FINDINGS: Lower chest: No acute abnormality. Hepatobiliary: No focal liver abnormality is seen. Status post cholecystectomy. No biliary dilatation. Pancreas: Unremarkable. No pancreatic ductal dilatation or surrounding inflammatory changes. Spleen: Normal in size without significant abnormality. Adrenals/Urinary Tract: Adrenal glands are unremarkable. Nonobstructive calculus of the midportion of the right kidney measuring 0.4 cm. No ureteral calculi or hydronephrosis. The left kidney is normal, without renal calculi, obvious solid lesion, or hydronephrosis. Mild thickening of the urinary bladder wall. Stomach/Bowel: Stomach is within normal limits. Appendix appears normal. No evidence of bowel wall thickening, distention, or inflammatory changes. Sigmoid diverticula. Vascular/Lymphatic: Aortic atherosclerosis. No  enlarged abdominal or pelvic lymph nodes. Reproductive: No mass or other significant abnormality. Other: No abdominal wall hernia or abnormality. No ascites. Musculoskeletal: No acute or significant osseous findings. IMPRESSION: 1. Small nonobstructive calculus of the midportion of the right kidney. No ureteral calculi or hydronephrosis. 2. Mild thickening of the urinary bladder wall, suggestive of nonspecific infectious or inflammatory cystitis. Correlate with urinalysis. 3. Sigmoid diverticulosis without evidence of acute diverticulitis. Aortic Atherosclerosis (ICD10-I70.0). Electronically Signed   By: Delanna Ahmadi M.D.   On: 03/19/2022 16:56      Assessment/Plan: Microhematuria with urgency, frequency and UUI.  I will get a cytology today.   She will continue the Myrbetriq.   Pelvic pain.  She didn't take the aleve but the pelvic pain is better.    Right renal stone.  This stone has grown over time but remains passable.  I will keep an eye on the renal stones with a KUB in 6 months.     Meds ordered this encounter  Medications   DISCONTD: mirabegron ER (MYRBETRIQ) 25 MG TB24 tablet    Sig: Take 1 tablet (25 mg total) by mouth daily.    Dispense:  30 tablet    Refill:  11   mirabegron ER (MYRBETRIQ) 25 MG TB24 tablet    Sig: Take 1 tablet (25 mg total) by mouth daily.    Dispense:  30 tablet    Refill:  11      Orders Placed This Encounter  Procedures   Microscopic Examination   DG Abd 1 View    Standing Status:   Future    Standing Expiration Date:   05/07/2023    Order Specific Question:   Reason for Exam (SYMPTOM  OR DIAGNOSIS REQUIRED)    Answer:   renal stones    Order Specific Question:   Preferred imaging location?    Answer:   [facility]    Order Specific Question:   Radiology Contrast Protocol - do NOT remove file path  Answer:   [email or web address]   Urinalysis, Routine w reflex microscopic     Return in  about 6 months (around 11/07/2022).    CC: Evelina Dun FNP.     Irine Seal 05/08/2022

## 2022-05-08 ENCOUNTER — Encounter: Payer: Self-pay | Admitting: Urology

## 2022-05-08 LAB — CYTOLOGY, URINE

## 2022-06-16 ENCOUNTER — Telehealth: Payer: Self-pay

## 2022-06-16 NOTE — Telephone Encounter (Signed)
Patient called advising that her insurance will not cover mirabegron ER (MYRBETRIQ) 25 MG TB24 tablet She wanted to know if there was an alternative. She advised she would like it called into the pharmacy below if so.    Pharmacy:  [facility] [address] [place] - [address]

## 2022-06-17 ENCOUNTER — Other Ambulatory Visit: Payer: Self-pay

## 2022-06-17 MED ORDER — SOLIFENACIN SUCCINATE 5 MG PO TABS: 5.0000 mg | ORAL_TABLET | Freq: Every day | ORAL | 11 refills | Status: DC

## 2022-06-17 NOTE — Telephone Encounter (Signed)
Rx sent to pharmacy and patient aware of MD response.  She will reach out if she cannot tolerate rx or if she needs to increase to 10mg .

## 2022-06-30 ENCOUNTER — Ambulatory Visit (INDEPENDENT_AMBULATORY_CARE_PROVIDER_SITE_OTHER): Payer: Medicare HMO | Admitting: Nurse Practitioner

## 2022-06-30 ENCOUNTER — Telehealth: Payer: Self-pay | Admitting: Nurse Practitioner

## 2022-06-30 ENCOUNTER — Encounter: Payer: Self-pay | Admitting: Nurse Practitioner

## 2022-06-30 ENCOUNTER — Other Ambulatory Visit: Payer: Self-pay

## 2022-06-30 VITALS — BP 127/71 | HR 82 | Temp 97.7°F | Resp 20 | Ht 62.0 in | Wt 114.0 lb

## 2022-06-30 DIAGNOSIS — J0100 Acute maxillary sinusitis, unspecified: Secondary | ICD-10-CM

## 2022-06-30 MED ORDER — BENZONATATE 100 MG PO CAPS: 100.0000 mg | ORAL_CAPSULE | Freq: Three times a day (TID) | ORAL | 0 refills | Status: DC | PRN

## 2022-06-30 MED ORDER — FLUTICASONE PROPIONATE 50 MCG/ACT NA SUSP: 1.0000 | Freq: Every day | NASAL | 2 refills | Status: AC

## 2022-06-30 MED ORDER — FLUCONAZOLE 150 MG PO TABS
150.0000 mg | ORAL_TABLET | Freq: Once | ORAL | 0 refills | Status: AC
Start: 2022-06-30 — End: 2022-06-30

## 2022-06-30 MED ORDER — FLUTICASONE PROPIONATE 50 MCG/ACT NA SUSP: 2.0000 | Freq: Every day | NASAL | 6 refills | Status: DC

## 2022-06-30 MED ORDER — AMOXICILLIN-POT CLAVULANATE 875-125 MG PO TABS: 1.0000 | ORAL_TABLET | Freq: Two times a day (BID) | ORAL | 0 refills | Status: DC

## 2022-06-30 NOTE — Patient Instructions (Signed)

## 2022-06-30 NOTE — Telephone Encounter (Signed)
Pt forgot to ask MMM if she needs to keep taking the sudafed and mucinex while taking what MMM prescribed for her.

## 2022-06-30 NOTE — Telephone Encounter (Signed)
Patient notified ok to take  

## 2022-06-30 NOTE — Progress Notes (Signed)
Subjective:    Patient ID: Susan Baird, female    DOB: 09-09-1955, 67 y.o.   MRN: [id number]   Chief Complaint: Nasal Congestion and chest congestion   URI  This is a new problem. The current episode started 1 to 4 weeks ago. The problem has been waxing and waning. There has been no fever. Associated symptoms include congestion, coughing, ear pain and rhinorrhea. Pertinent negatives include no headaches or sore throat. Treatments tried: saline rinse, sudafed and mucinex. The treatment provided mild relief.    Patient Active Problem List   Diagnosis Date Noted   Serous otitis media 07/08/2015   Tobacco abuse 05/30/2015   Seasonal allergies 05/30/2015       Review of Systems  Constitutional:  Negative for chills and fever.  HENT:  Positive for congestion, ear pain and rhinorrhea. Negative for sore throat.   Respiratory:  Positive for cough.   Neurological:  Negative for headaches.       Objective:   Physical Exam Vitals reviewed.  Constitutional:      Appearance: Normal appearance.  HENT:     Right Ear: Tympanic membrane normal.     Left Ear: Tympanic membrane normal.     Nose: Congestion and rhinorrhea present.     Right Sinus: Maxillary sinus tenderness present. No frontal sinus tenderness.     Left Sinus: Maxillary sinus tenderness present. No frontal sinus tenderness.     Mouth/Throat:     Pharynx: No oropharyngeal exudate or posterior oropharyngeal erythema.  Eyes:     Pupils: Pupils are equal, round, and reactive to light.  Cardiovascular:     Rate and Rhythm: Normal rate and regular rhythm.  Pulmonary:     Breath sounds: Normal breath sounds.  Skin:    General: Skin is warm.  Neurological:     General: No focal deficit present.     Mental Status: She is alert and oriented to person, place, and time.  Psychiatric:        Mood and Affect: Mood normal.        Behavior: Behavior normal.     BP 127/71   Pulse 82   Temp 97.7 F (36.5 C) (Temporal)    Resp 20   Ht 5\' 2"  (1.575 m)   Wt 114 lb (51.7 kg)   SpO2 97%   BMI 20.85 kg/m        Assessment & Plan:   Coti L Boot in today with chief complaint of Nasal Congestion and chest congestion   1. Acute non-recurrent maxillary sinusitis 1. Take meds as prescribed 2. Use a cool mist humidifier especially during the winter months and when heat has been humid. 3. Use saline nose sprays frequently 4. Saline irrigations of the nose can be very helpful if done frequently.  * 4X daily for 1 week*  * Use of a nettie pot can be helpful with this. Follow directions with this* 5. Drink plenty of fluids 6. Keep thermostat turn down low 7.For any cough or congestion- tessalon perles 8. For fever or aces or pains- take tylenol or ibuprofen appropriate for age and weight.  * for fevers greater than 101 orally you may alternate ibuprofen and tylenol every  3 hours.    - amoxicillin-clavulanate (AUGMENTIN) 875-125 MG tablet; Take 1 tablet by mouth 2 (two) times daily.  Dispense: 14 tablet; Refill: 0 - benzonatate (TESSALON PERLES) 100 MG capsule; Take 1 capsule (100 mg total) by mouth 3 (three) times daily as needed  for cough.  Dispense: 20 capsule; Refill: 0 - fluticasone (FLONASE) 50 MCG/ACT nasal spray; Place 2 sprays into both nostrils daily.  Dispense: 16 g; Refill: 6 - fluconazole (DIFLUCAN) 150 MG tablet; Take 1 tablet (150 mg total) by mouth once for 1 dose.  Dispense: 1 tablet; Refill: 0    The above assessment and management plan was discussed with the patient. The patient verbalized understanding of and has agreed to the management plan. Patient is aware to call the clinic if symptoms persist or worsen. Patient is aware when to return to the clinic for a follow-up visit. Patient educated on when it is appropriate to go to the emergency department.   Mary-Margaret Daphine Deutscher, FNP

## 2022-07-17 ENCOUNTER — Ambulatory Visit (INDEPENDENT_AMBULATORY_CARE_PROVIDER_SITE_OTHER): Payer: Medicare HMO | Admitting: Family Medicine

## 2022-07-17 ENCOUNTER — Encounter: Payer: Self-pay | Admitting: Family Medicine

## 2022-07-17 VITALS — BP 116/63 | HR 89 | Temp 98.0°F | Ht 62.0 in | Wt 114.6 lb

## 2022-07-17 DIAGNOSIS — J302 Other seasonal allergic rhinitis: Secondary | ICD-10-CM | POA: Diagnosis not present

## 2022-07-17 DIAGNOSIS — L989 Disorder of the skin and subcutaneous tissue, unspecified: Secondary | ICD-10-CM | POA: Diagnosis not present

## 2022-07-17 DIAGNOSIS — J329 Chronic sinusitis, unspecified: Secondary | ICD-10-CM | POA: Diagnosis not present

## 2022-07-17 MED ORDER — LEVOCETIRIZINE DIHYDROCHLORIDE 5 MG PO TABS
5.0000 mg | ORAL_TABLET | Freq: Every evening | ORAL | 2 refills | Status: DC
Start: 2022-07-17 — End: 2023-02-18

## 2022-07-17 MED ORDER — FLUCONAZOLE 150 MG PO TABS
150.0000 mg | ORAL_TABLET | Freq: Once | ORAL | 0 refills | Status: AC
Start: 2022-07-17 — End: 2022-07-17

## 2022-07-17 MED ORDER — DOXYCYCLINE HYCLATE 100 MG PO TABS
100.0000 mg | ORAL_TABLET | Freq: Two times a day (BID) | ORAL | 0 refills | Status: AC
Start: 2022-07-17 — End: 2022-07-24

## 2022-07-17 NOTE — Patient Instructions (Addendum)
Xyzal or Allegra  Plus Flonase  1. Take meds as prescribed.  2. Use a cool mist humidifier especially during the winter months and when heat has been humid. 3. Use saline nose sprays frequently.  4. Saline irrigations of the nose can be very helpful if done frequently. Use 4 times daily for one week. Can use Nettie Pot if able to tolerate.  5. Drink plenty of fluids 6. Keep thermostat turned down low. 7.For any cough or congestion: Use plain Mucinex- regular strength or max strength is fine. 8. For fever, aches or pains- take tylenol or ibuprofen appropriate for age and weight, for fevers greater than 101 orally you may alternate ibuprofen and tylenol every 3 hours. 9. Change out your toothbrush 10. Return to clinic if symptoms worsen or do not improve.

## 2022-07-17 NOTE — Progress Notes (Signed)
Acute Office Visit  Subjective:  Patient ID: Susan Baird, female    DOB: 27-Mar-1955, 67 y.o.   MRN: [id number]  Chief Complaint  Patient presents with   Nasal Congestion   Chest Congestion   Cough   Ear Fullness   Cough  Ear Fullness  Associated symptoms include coughing.   Patient is in today for cough and congestion that has been ongoing for a few weeks. States that she was treated with amoxicillin, which helped but is not resolved. She completed the Augmentin one week ago. She still has ear fullness, cough, congestion, chest congestion. She coughs in the morning and in the evenings until bedtime. Is not taking anything currently. She was previously taking pseudoephedrine and mucinex. States that her chest is feeling a lot better than what it was, but is still tight and she is sometimes coughing up sputum. She states that she stopped using her flonase, mucinex and saline rinses last week because she did not feel that they were helping.   Patient has skin lesion on back of right [place]. One bothers her more than the other. She states she has had them for quite some time. States that she picks and them and they crust off. She reports that she is in the sun "all the time" and would like to have a skin assessment referral   Review of Systems  Respiratory:  Positive for cough.    As per HPI  Objective:  BP 116/63   Pulse 89   Temp 98 F (36.7 C)   Ht 5\' 2"  (1.575 m)   Wt 114 lb 9.6 oz (52 kg)   SpO2 96%   BMI 20.96 kg/m   Physical Exam Constitutional:      General: She is awake. She is not in acute distress.    Appearance: Normal appearance. She is well-developed and well-groomed. She is not ill-appearing, toxic-appearing or diaphoretic.  HENT:     Right Ear: A middle ear effusion is present. No hemotympanum. Tympanic membrane is not injected, scarred, perforated, erythematous, retracted or bulging.     Left Ear: A middle ear effusion is present. No hemotympanum. Tympanic  membrane is not injected, scarred, perforated, erythematous, retracted or bulging.     Nose: Rhinorrhea present. Rhinorrhea is clear.     Right Turbinates: Enlarged and swollen. Not pale.     Left Turbinates: Not enlarged, swollen or pale.     Right Sinus: Frontal sinus tenderness present. No maxillary sinus tenderness.     Left Sinus: Frontal sinus tenderness present. No maxillary sinus tenderness.     Mouth/Throat:     Lips: Pink.     Mouth: Mucous membranes are moist. No injury, lacerations, oral lesions or angioedema.     Tongue: No lesions. Tongue does not deviate from midline.     Palate: No mass and lesions.     Pharynx: Posterior oropharyngeal erythema present. No pharyngeal swelling, oropharyngeal exudate or uvula swelling.     Tonsils: No tonsillar exudate or tonsillar abscesses. 3+ on the right. 1+ on the left.  Eyes:     Conjunctiva/sclera:     Right eye: Right conjunctiva is injected. No chemosis, exudate or hemorrhage.    Left eye: Left conjunctiva is injected. No chemosis, exudate or hemorrhage. Neck:     Thyroid: No thyroid mass, thyromegaly or thyroid tenderness.  Cardiovascular:     Rate and Rhythm: Normal rate and regular rhythm.     Pulses: Normal pulses.  Radial pulses are 2+ on the right side and 2+ on the left side.       Posterior tibial pulses are 2+ on the right side and 2+ on the left side.     Heart sounds: Normal heart sounds. No murmur heard.    No gallop.  Pulmonary:     Effort: Pulmonary effort is normal. No respiratory distress.     Breath sounds: Decreased air movement present. No stridor. Examination of the right-upper field reveals decreased breath sounds. Examination of the left-upper field reveals decreased breath sounds. Examination of the right-middle field reveals decreased breath sounds. Examination of the left-middle field reveals decreased breath sounds. Decreased breath sounds present. No wheezing, rhonchi or rales.  Musculoskeletal:      Cervical back: Full passive range of motion without pain and neck supple.     Right lower leg: No edema.     Left lower leg: No edema.  Lymphadenopathy:     Cervical: No cervical adenopathy.     Right cervical: No superficial, deep or posterior cervical adenopathy.    Left cervical: No superficial, deep or posterior cervical adenopathy.  Skin:    General: Skin is warm.     Capillary Refill: Capillary refill takes less than 2 seconds.     Comments: Two small <75mm round scaly, lesions on back of right [place] with whitish appearance.   Neurological:     General: No focal deficit present.     Mental Status: She is alert, oriented to person, place, and time and easily aroused. Mental status is at baseline.     GCS: GCS eye subscore is 4. GCS verbal subscore is 5. GCS motor subscore is 6.     Motor: No weakness.  Psychiatric:        Attention and Perception: Attention and perception normal.        Mood and Affect: Mood and affect normal.        Speech: Speech normal.        Behavior: Behavior normal. Behavior is cooperative.        Thought Content: Thought content normal. Thought content does not include homicidal or suicidal ideation. Thought content does not include homicidal or suicidal plan.        Cognition and Memory: Cognition and memory normal.        Judgment: Judgment normal.    Assessment & Plan:  1. Recurrent rhinosinusitis Will treat with antibiotic as below for unresolved sinusitis as per UTD recommendations. Provided diflucan for possible yeast infection after use of antibiotics.  - doxycycline (VIBRA-TABS) 100 MG tablet; Take 1 tablet (100 mg total) by mouth 2 (two) times daily for 7 days.  Dispense: 14 tablet; Refill: 0 - fluconazole (DIFLUCAN) 150 MG tablet; Take 1 tablet (150 mg total) by mouth once for 1 dose.  Dispense: 1 tablet; Refill: 0  2. Skin lesion of [place] Discussed with patient that lesions are likely actinic keratosis. Referral placed to Dermatology as  below for patient to follow up for skin assessment and evaluation especially given increased exposure to the sun.  - Ambulatory referral to Dermatology  3. Seasonal allergies Discussed with patient that she may have built up a tolerance to zyrtec and can trial a different antihistamine for allergies. Encouraged patient to take at least a 3 month trial off of zyrtec.   - levocetirizine (XYZAL) 5 MG tablet; Take 1 tablet (5 mg total) by mouth every evening.  Dispense: 30 tablet; Refill: 2  The above  assessment and management plan was discussed with the patient. The patient verbalized understanding of and has agreed to the management plan using shared-decision making. Patient is aware to call the clinic if they develop any new symptoms or if symptoms fail to improve or worsen. Patient is aware when to return to the clinic for a follow-up visit. Patient educated on when it is appropriate to go to the emergency department.   Return if symptoms worsen or fail to improve.  Neale Burly, DNP-FNP [facility] [address] [place], [place] [address] [phone]

## 2022-07-21 ENCOUNTER — Telehealth: Payer: Self-pay | Admitting: Family Medicine

## 2022-11-05 ENCOUNTER — Ambulatory Visit: Payer: Medicare HMO | Admitting: Urology

## 2022-11-11 ENCOUNTER — Telehealth: Payer: Self-pay

## 2022-11-11 NOTE — Telephone Encounter (Signed)
Patient noticed vision changes while taking vesicare.  She stopped the medication about a month ago and her eyes cleared up.  She is asking if there is an alternative she can take

## 2022-11-11 NOTE — Telephone Encounter (Signed)
Patient has tried that in the past with no improvement of symptoms, she still has samples at home.  Is there another alternative?

## 2022-11-12 NOTE — Telephone Encounter (Signed)
Samples provided at front desk.  Patient will call back if gemtesa improves symptoms

## 2022-11-19 ENCOUNTER — Ambulatory Visit: Payer: Medicare HMO | Admitting: Urology

## 2022-11-19 VITALS — BP 124/69 | HR 94

## 2022-11-19 DIAGNOSIS — N200 Calculus of kidney: Secondary | ICD-10-CM | POA: Diagnosis not present

## 2022-11-19 DIAGNOSIS — N3941 Urge incontinence: Secondary | ICD-10-CM

## 2022-11-19 DIAGNOSIS — R3129 Other microscopic hematuria: Secondary | ICD-10-CM

## 2022-11-19 DIAGNOSIS — R350 Frequency of micturition: Secondary | ICD-10-CM | POA: Diagnosis not present

## 2022-11-19 NOTE — Progress Notes (Signed)
Subjective: 1. Urge incontinence   2. Urinary frequency   3. Microhematuria   4. Nephrolithiasis     11/20/22: Susan Baird returns today in f/u.  She developed blurry vision on vesicare and was changed to [facility] about 5 days ago.  She had a flair of symptoms about 5 weeks ago after drinking sugar free apple cider.   She stopped that and the symptoms improved.   Her UA today has 3-10 RBC's.  She is doing ok on [place].   05/07/22: Susan Baird returns today in f/u to assess her response to [facility] for her OAB with UUI.   She also was to use an OTC NSAID for the pelvic pain.  She didn't get a cytology at her last visit and I will consider that today.  Her cystoscopy for the microhematuria was negative.  She had small stones on CT.   She had a response to Myrbetriq 25mg .  She has had no nausea or HA but did have a little transient abdominal pain.  She has reduced frequency and she can hold it better.   She has 3-10 RBC's todays.    2/8/24[name] returns today for cystoscopy.  Her CT showed a 4 non-obstructing RLP renal stone that was 2mm on prior CT and some bladder wall thickening.  She has diverticulosis without inflammation. She is for cystoscopy today.   She still has some urgency and frequency with UUI and some pelvic and low back pain.  UA has 3-10 RBC's.   03/19/22: Susan Baird is a 67 yo female who has been seen in the [place] office in the past for a stones.  She is sent in consultation today for abdominal discomfort with frequency and hematuria with 2+ blood on a dip UA on 03/13/22 but only 0-2 RBC's on micro.  Her last CT in 2016 showed a small RLP stone. In October she was treated for a UTI with a couple of rounds of antibiotics but after that she began to have some pelvic and lower abdominal pain.   She then started having lower back and flank pain on the right.  She has urgency with UUI if not careful.  She has no increased frequency or nocturia.  Her PVR is 0ml. Her UA has 11-30 RBC's.  She has had  mild nausea x 2 in the last 2 months.     ROS:  Review of Systems  Constitutional:        She has some night sweats.   HENT:  Positive for congestion.     No Known Allergies  Past Medical History:  Diagnosis Date   Allergies    Kidney stone     Past Surgical History:  Procedure Laterality Date   CESAREAN SECTION     CHOLECYSTECTOMY N/A 04/05/2019   Procedure: LAPAROSCOPIC CHOLECYSTECTOMY WITH INTRAOPERATIVE CHOLANGIOGRAM;  Surgeon: Manus Rudd, MD;  Location: [facility];  Service: General;  Laterality: N/A;   TUBAL LIGATION      Social History   Socioeconomic History   Marital status: Married    Spouse name: Not on file   Number of children: Not on file   Years of education: Not on file   Highest education level: Not on file  Occupational History   Not on file  Tobacco Use   Smoking status: Every Day    Current packs/day: 1.00    Types: Cigarettes   Smokeless tobacco: Never  Vaping Use   Vaping status: Never Used  Substance and Sexual Activity  Alcohol use: No   Drug use: No   Sexual activity: Not on file  Other Topics Concern   Not on file  Social History Narrative   Not on file   Social Determinants of Health   Financial Resource Strain: Not on file  Food Insecurity: Not on file  Transportation Needs: Not on file  Physical Activity: Not on file  Stress: Not on file  Social Connections: Not on file  Intimate Partner Violence: Not on file    No family history on file.  Anti-infectives: Anti-infectives (From admission, onward)    None       Current Outpatient Medications  Medication Sig Dispense Refill   Cholecalciferol (VITAMIN D3) 30 MCG/15ML LIQD Take by mouth.     Cyanocobalamin (VITAMIN B 12 PO) Take by mouth.     ELDERBERRY PO Take by mouth.     fluticasone (FLONASE) 50 MCG/ACT nasal spray Place 1 spray into both nostrils daily. 1 g 2   fluticasone (FLONASE) 50 MCG/ACT nasal spray Place 2 sprays into both nostrils  daily. 16 g 6   Magnesium 250 MG TABS Take by mouth.     Multiple Vitamin (MULTIVITAMIN ADULT PO) Take by mouth.     Potassium 95 MG TABS Take by mouth. Takes 3 times a week OTC     Probiotic Product (PROBIOTIC PO) Take by mouth.     solifenacin (VESICARE) 5 MG tablet Take 1 tablet (5 mg total) by mouth daily. 30 tablet 11   levocetirizine (XYZAL) 5 MG tablet Take 1 tablet (5 mg total) by mouth every evening. 30 tablet 2   Current Facility-Administered Medications  Medication Dose Route Frequency Provider Last Rate Last Admin   ciprofloxacin (CIPRO) tablet 500 mg  500 mg Oral Once Susan Pippin, MD         Objective: Vital signs in last 24 hours: BP 124/69   Pulse 94   Intake/Output from previous day: No intake/output data recorded. Intake/Output this shift: @IOTHISSHIFT @   Physical Exam Vitals reviewed.  Constitutional:      Appearance: Normal appearance.  Neurological:     Mental Status: She is alert.     Lab Results:  Results for orders placed or performed in visit on 11/19/22 (from the past 24 hour(s))  Urinalysis, Routine w reflex microscopic     Status: Abnormal   Collection Time: 11/19/22  2:21 PM  Result Value Ref Range   Specific Gravity, UA 1.020 1.005 - 1.030   pH, UA 7.0 5.0 - 7.5   Color, UA Yellow Yellow   Appearance Ur Clear Clear   Leukocytes,UA Trace (A) Negative   Protein,UA Negative Negative/Trace   Glucose, UA Negative Negative   Ketones, UA Negative Negative   RBC, UA 2+ (A) Negative   Bilirubin, UA Negative Negative   Urobilinogen, Ur 0.2 0.2 - 1.0 mg/dL   Nitrite, UA Negative Negative   Microscopic Examination See below:    Narrative   Performed at:  [address] - [facility] [address], [place], [place]  [id number] Lab Director: Chinita Pester MT, Phone:  [phone]  Microscopic Examination     Status: Abnormal   Collection Time: 11/19/22  2:21 PM   Urine  Result Value Ref Range   WBC, UA 6-10 (A) 0 - 5 /hpf   RBC, Urine 3-10  (A) 0 - 2 /hpf   Epithelial Cells (non renal) 0-10 0 - 10 /hpf   Bacteria, UA Few (A) None seen/Few   Narrative  Performed at:  [address] - [facility] [address], [place], [place]  [id number] Lab Director: Chinita Pester MT, Phone:  [phone]     UA reviewed.   BMET No results for input(s): "NA", "K", "CL", "CO2", "GLUCOSE", "BUN", "CREATININE", "CALCIUM" in the last 72 hours. PT/INR No results for input(s): "LABPROT", "INR" in the last 72 hours. ABG No results for input(s): "PHART", "HCO3" in the last 72 hours.  Invalid input(s): "PCO2", "PO2" UA on 03/19/22 had 11-30 RBC's.  UA on 03/26/22 has 3-10 RBC's.  UA on 05/07/22 has 3-10 RBC's.  Studies/Results: No results found.  No results found.    Assessment/Plan: Microhematuria with urgency, frequency and UUI.  She had a flair with diet apple cider but is better off of it.  She is on Gem[place] samples for now but will stop it after she completes that.   Pelvic pain.  She didn't take the aleve but the pelvic pain is better.    Right renal stone.  This stone has grown over time but remained passable at last check.  I will keep an eye on the renal stones with a KUB.   No orders of the defined types were placed in this encounter.     Orders Placed This Encounter  Procedures   Microscopic Examination   DG Abd 1 View    Standing Status:   Future    Standing Expiration Date:   05/20/2023    Order Specific Question:   Reason for Exam (SYMPTOM  OR DIAGNOSIS REQUIRED)    Answer:   renal stone    Order Specific Question:   Preferred imaging location?    Answer:   [facility]    Order Specific Question:   Radiology Contrast Protocol - do NOT remove file path    Answer:   [email or web address]   Urinalysis, Routine w reflex microscopic     Return in about 6 months (around 05/20/2023).    CC: Jannifer Rodney FNP.     Susan Pippin 11/20/2022

## 2022-11-20 LAB — URINALYSIS, ROUTINE W REFLEX MICROSCOPIC
Bilirubin, UA: NEGATIVE
Glucose, UA: NEGATIVE
Ketones, UA: NEGATIVE
Nitrite, UA: NEGATIVE
Protein,UA: NEGATIVE
Specific Gravity, UA: 1.02 (ref 1.005–1.030)
Urobilinogen, Ur: 0.2 mg/dL (ref 0.2–1.0)
pH, UA: 7 (ref 5.0–7.5)

## 2022-11-20 LAB — MICROSCOPIC EXAMINATION

## 2022-11-26 ENCOUNTER — Ambulatory Visit: Payer: Medicare HMO | Admitting: Urology

## 2023-01-11 ENCOUNTER — Ambulatory Visit (HOSPITAL_COMMUNITY)
Admission: RE | Admit: 2023-01-11 | Discharge: 2023-01-11 | Disposition: A | Discharge status: Home / Self Care | Payer: Medicare HMO | Source: Ambulatory Visit | Attending: Urology | Admitting: Urology

## 2023-01-11 DIAGNOSIS — R3129 Other microscopic hematuria: Secondary | ICD-10-CM | POA: Insufficient documentation

## 2023-01-11 DIAGNOSIS — N200 Calculus of kidney: Secondary | ICD-10-CM | POA: Insufficient documentation

## 2023-01-18 DIAGNOSIS — H524 Presbyopia: Secondary | ICD-10-CM | POA: Diagnosis not present

## 2023-01-18 DIAGNOSIS — H5203 Hypermetropia, bilateral: Secondary | ICD-10-CM | POA: Diagnosis not present

## 2023-01-18 DIAGNOSIS — H40012 Open angle with borderline findings, low risk, left eye: Secondary | ICD-10-CM | POA: Diagnosis not present

## 2023-01-18 DIAGNOSIS — H52223 Regular astigmatism, bilateral: Secondary | ICD-10-CM | POA: Diagnosis not present

## 2023-01-25 ENCOUNTER — Telehealth: Payer: Self-pay

## 2023-01-25 NOTE — Telephone Encounter (Signed)
Pt made aware of  MD response. Pt wanted to know what MD recommended her to drink instead of coffee

## 2023-01-25 NOTE — Telephone Encounter (Signed)
-----   Message from Bjorn Pippin sent at 01/25/2023 12:06 PM EST ----- No change in the 4mm stone. ----- Message ----- From: Sarajane Jews, CMA Sent: 01/25/2023   8:00 AM EST To: Bjorn Pippin, MD  Please review

## 2023-02-18 ENCOUNTER — Ambulatory Visit (INDEPENDENT_AMBULATORY_CARE_PROVIDER_SITE_OTHER): Payer: Medicare HMO

## 2023-02-18 VITALS — Ht 62.0 in | Wt 114.0 lb

## 2023-02-18 DIAGNOSIS — Z0000 Encounter for general adult medical examination without abnormal findings: Secondary | ICD-10-CM

## 2023-02-18 DIAGNOSIS — Z1231 Encounter for screening mammogram for malignant neoplasm of breast: Secondary | ICD-10-CM | POA: Diagnosis not present

## 2023-02-18 NOTE — Progress Notes (Signed)
 Subjective:   Susan Baird is a 68 y.o. female who presents for an Initial Medicare Annual Wellness Visit.  Visit Complete: Virtual I connected with  Hayes L Gongora on 02/18/23 by a video and audio enabled telemedicine application and verified that I am speaking with the correct person using two identifiers.  Patient Location: Home  Provider Location: Home Office  I discussed the limitations of evaluation and management by telemedicine. The patient expressed understanding and agreed to proceed.  Vital Signs: Because this visit was a virtual/telehealth visit, some criteria may be missing or patient reported. Any vitals not documented were not able to be obtained and vitals that have been documented are patient reported.  Cardiac Risk Factors include: advanced age (>35men, >19 women)     Objective:    Today's Vitals   02/18/23 0905  Weight: 114 lb (51.7 kg)  Height: 5' 2 (1.575 m)   Body mass index is 20.85 kg/m.     02/18/2023    9:10 AM 04/05/2019    8:41 AM 03/15/2014    2:26 AM  Advanced Directives  Does Patient Have a Medical Advance Directive? No No No  Does patient want to make changes to medical advance directive?  No - Patient declined   Would patient like information on creating a medical advance directive? Yes (MAU/Ambulatory/Procedural Areas - Information given) No - Patient declined No - patient declined information    Current Medications (verified) Outpatient Encounter Medications as of 02/18/2023  Medication Sig   Cholecalciferol (VITAMIN D3) 30 MCG/15ML LIQD Take by mouth.   Cyanocobalamin (VITAMIN B 12 PO) Take by mouth.   ELDERBERRY PO Take by mouth.   fluticasone  (FLONASE ) 50 MCG/ACT nasal spray Place 1 spray into both nostrils daily.   fluticasone  (FLONASE ) 50 MCG/ACT nasal spray Place 2 sprays into both nostrils daily.   Magnesium 250 MG TABS Take by mouth.   Multiple Vitamin (MULTIVITAMIN ADULT PO) Take by mouth.   Potassium 95 MG TABS Take by mouth.  Takes 3 times a week OTC   Probiotic Product (PROBIOTIC PO) Take by mouth.   solifenacin  (VESICARE ) 5 MG tablet Take 1 tablet (5 mg total) by mouth daily.   [DISCONTINUED] levocetirizine (XYZAL ) 5 MG tablet Take 1 tablet (5 mg total) by mouth every evening.   Facility-Administered Encounter Medications as of 02/18/2023  Medication   ciprofloxacin  (CIPRO ) tablet 500 mg    Allergies (verified) Patient has no known allergies.   History: Past Medical History:  Diagnosis Date   Allergies    Allergy    Kidney stone    Past Surgical History:  Procedure Laterality Date   CESAREAN SECTION     CHOLECYSTECTOMY N/A 04/05/2019   Procedure: LAPAROSCOPIC CHOLECYSTECTOMY WITH INTRAOPERATIVE CHOLANGIOGRAM;  Surgeon: Belinda Cough, MD;  Location: [facility];  Service: General;  Laterality: N/A;   TUBAL LIGATION     History reviewed. No pertinent family history. Social History   Socioeconomic History   Marital status: Married    Spouse name: Not on file   Number of children: Not on file   Years of education: Not on file   Highest education level: 12th grade  Occupational History   Not on file  Tobacco Use   Smoking status: Every Day    Current packs/day: 1.00    Types: Cigarettes   Smokeless tobacco: Never  Vaping Use   Vaping status: Never Used  Substance and Sexual Activity   Alcohol use: No   Drug use:  No   Sexual activity: Not Currently  Other Topics Concern   Not on file  Social History Narrative   Not on file   Social Drivers of Health   Financial Resource Strain: Patient Declined (02/16/2023)   Overall Financial Resource Strain (CARDIA)    Difficulty of Paying Living Expenses: Patient declined  Food Insecurity: No Food Insecurity (02/16/2023)   Hunger Vital Sign    Worried About Running Out of Food in the Last Year: Never true    Ran Out of Food in the Last Year: Never true  Transportation Needs: No Transportation Needs (02/16/2023)   PRAPARE -  Administrator, Civil Service (Medical): No    Lack of Transportation (Non-Medical): No  Physical Activity: Inactive (02/16/2023)   Exercise Vital Sign    Days of Exercise per Week: 0 days    Minutes of Exercise per Session: 0 min  Stress: No Stress Concern Present (02/16/2023)   [employer] of Occupational Health - Occupational Stress Questionnaire    Feeling of Stress : Not at all  Social Connections: Unknown (02/16/2023)   Social Connection and Isolation Panel [NHANES]    Frequency of Communication with Friends and Family: More than three times a week    Frequency of Social Gatherings with Friends and Family: Patient declined    Attends Religious Services: Patient declined    Database Administrator or Organizations: No    Attends Engineer, Structural: Never    Marital Status: Married    Tobacco Counseling Ready to quit: Not Answered Counseling given: Not Answered   Clinical Intake:  Pre-visit preparation completed: Yes  Pain : No/denies pain     Diabetes: No  How often do you need to have someone help you when you read instructions, pamphlets, or other written materials from your doctor or pharmacy?: 1 - Never  Interpreter Needed?: No  Information entered by :: Charmaine Bloodgood LPN   Activities of Daily Living    02/16/2023   12:39 PM  In your present state of health, do you have any difficulty performing the following activities:  Hearing? 0  Vision? 0  Difficulty concentrating or making decisions? 0  Walking or climbing stairs? 0  Dressing or bathing? 0  Doing errands, shopping? 0  Preparing Food and eating ? N  Using the Toilet? N  In the past six months, have you accidently leaked urine? N  Do you have problems with loss of bowel control? N  Managing your Medications? N  Managing your Finances? N  Housekeeping or managing your Housekeeping? N    Patient Care Team: Jolinda Norene HERO, DO as PCP - General (Family  Medicine) Watt Rush, MD as Attending Physician (Urology)  Indicate any recent Medical Services you may have received from other than [facility] providers in the past year (date may be approximate).     Assessment:   This is a routine wellness examination for [place].  Hearing/Vision screen Hearing Screening - Comments:: Denies hearing difficulties   Vision Screening - Comments:: No vision problems; will schedule routine eye exam soon     Goals Addressed             This Visit's Progress    Remain active and independent        Depression Screen    02/18/2023    9:08 AM 06/30/2022    8:14 AM 01/02/2022   10:24 AM 11/29/2017    2:00 PM 07/08/2015    9:45 AM  PHQ 2/9 Scores  PHQ - 2 Score 0 1 0 0 0  PHQ- 9 Score  3 2      Fall Risk    02/16/2023   12:39 PM 06/30/2022    8:14 AM 01/02/2022   10:24 AM 11/29/2017    2:00 PM 07/08/2015    9:45 AM  Fall Risk   Falls in the past year? 0 0 0 No No  Number falls in past yr: 0      Injury with Fall? 0      Risk for fall due to : No Fall Risks      Follow up Falls evaluation completed;Education provided;Falls prevention discussed        MEDICARE RISK AT HOME: Medicare Risk at Home Any stairs in or around the home?: (Patient-Rptd) Yes If so, are there any without handrails?: (Patient-Rptd) No Home free of loose throw rugs in walkways, pet beds, electrical cords, etc?: (Patient-Rptd) Yes Adequate lighting in your home to reduce risk of falls?: (Patient-Rptd) Yes Life alert?: (Patient-Rptd) No Use of a cane, walker or w/c?: (Patient-Rptd) No Grab bars in the bathroom?: (Patient-Rptd) No Shower chair or bench in shower?: (Patient-Rptd) No Elevated toilet seat or a handicapped toilet?: (Patient-Rptd) Yes  TIMED UP AND GO:  Was the test performed? No    Cognitive Function:        02/18/2023    9:10 AM  6CIT Screen  What Year? 0 points  What month? 0 points  What time? 0 points  Count back from 20 0 points  Months in  reverse 0 points  Repeat phrase 0 points  Total Score 0 points    Immunizations  There is no immunization history on file for this patient.  TDAP status: Due, Education has been provided regarding the importance of this vaccine. Advised may receive this vaccine at local pharmacy or Health Dept. Aware to provide a copy of the vaccination record if obtained from local pharmacy or Health Dept. Verbalized acceptance and understanding.  Flu Vaccine status: Declined, Education has been provided regarding the importance of this vaccine but patient still declined. Advised may receive this vaccine at local pharmacy or Health Dept. Aware to provide a copy of the vaccination record if obtained from local pharmacy or Health Dept. Verbalized acceptance and understanding.  Pneumococcal vaccine status: Declined,  Education has been provided regarding the importance of this vaccine but patient still declined. Advised may receive this vaccine at local pharmacy or Health Dept. Aware to provide a copy of the vaccination record if obtained from local pharmacy or Health Dept. Verbalized acceptance and understanding.   Covid-19 vaccine status: Declined, Education has been provided regarding the importance of this vaccine but patient still declined. Advised may receive this vaccine at local pharmacy or Health Dept.or vaccine clinic. Aware to provide a copy of the vaccination record if obtained from local pharmacy or Health Dept. Verbalized acceptance and understanding.  Qualifies for Shingles Vaccine? Yes   Zostavax completed No   Shingrix Completed?: No.    Education has been provided regarding the importance of this vaccine. Patient has been advised to call insurance company to determine out of pocket expense if they have not yet received this vaccine. Advised may also receive vaccine at local pharmacy or Health Dept. Verbalized acceptance and understanding.  Screening Tests Health Maintenance  Topic Date Due    Pneumonia Vaccine 38+ Years old (1 of 2 - PCV) Never done   HIV Screening  Never done  Hepatitis C Screening  Never done   DTaP/Tdap/Td (1 - Tdap) Never done   Colonoscopy  Never done   Zoster Vaccines- Shingrix (1 of 2) Never done   MAMMOGRAM  04/14/2014   Cervical Cancer Screening (HPV/Pap Cotest)  05/18/2019   DEXA SCAN  Never done   COVID-19 Vaccine (1 - 2024-25 season) Never done   INFLUENZA VACCINE  05/17/2023 (Originally 09/17/2022)   Medicare Annual Wellness (AWV)  02/18/2024   HPV VACCINES  Aged Out    Health Maintenance  Health Maintenance Due  Topic Date Due   Pneumonia Vaccine 37+ Years old (1 of 2 - PCV) Never done   HIV Screening  Never done   Hepatitis C Screening  Never done   DTaP/Tdap/Td (1 - Tdap) Never done   Colonoscopy  Never done   Zoster Vaccines- Shingrix (1 of 2) Never done   MAMMOGRAM  04/14/2014   Cervical Cancer Screening (HPV/Pap Cotest)  05/18/2019   DEXA SCAN  Never done   COVID-19 Vaccine (1 - 2024-25 season) Never done    Colorectal cancer screening:  Patient declines at this time   Mammogram status: Ordered today and scheduled   . Pt provided with contact info and advised to call to schedule appt.   Bone Density status:  Patient declines at this time   Lung Cancer Screening: (Low Dose CT Chest recommended if Age 5-80 years, 20 pack-year currently smoking OR have quit w/in 15years.) does not qualify.   Lung Cancer Screening Referral: n/a   Additional Screening:  Hepatitis C Screening: does qualify;   Vision Screening: Recommended annual ophthalmology exams for early detection of glaucoma and other disorders of the eye. Is the patient up to date with their annual eye exam?  No  Who is the provider or what is the name of the office in which the patient attends annual eye exams? none If pt is not established with a provider, would they like to be referred to a provider to establish care? No .   Dental Screening: Recommended annual  dental exams for proper oral hygiene  Community Resource Referral / Chronic Care Management: CRR required this visit?  No   CCM required this visit?  No     Plan:     I have personally reviewed and noted the following in the patient's chart:   Medical and social history Use of alcohol, tobacco or illicit drugs  Current medications and supplements including opioid prescriptions. Patient is not currently taking opioid prescriptions. Functional ability and status Nutritional status Physical activity Advanced directives List of other physicians Hospitalizations, surgeries, and ER visits in previous 12 months Vitals Screenings to include cognitive, depression, and falls Referrals and appointments  In addition, I have reviewed and discussed with patient certain preventive protocols, quality metrics, and best practice recommendations. A written personalized care plan for preventive services as well as general preventive health recommendations were provided to patient.     Lavelle Pfeiffer [place], [place]   09/22/7972   After Visit Summary: (MyChart) Due to this being a telephonic visit, the after visit summary with patients personalized plan was offered to patient via MyChart   Nurse Notes: No concerns at this time

## 2023-02-18 NOTE — Patient Instructions (Signed)
 Ms. Mires , Thank you for taking time to come for your Medicare Wellness Visit. I appreciate your ongoing commitment to your health goals. Please review the following plan we discussed and let me know if I can assist you in the future.   Referrals/Orders/Follow-Ups/Clinician Recommendations: Aim for 30 minutes of exercise or brisk walking, 6-8 glasses of water, and 5 servings of fruits and vegetables each day.  This is a list of the screening recommended for you and due dates:  Health Maintenance  Topic Date Due   Pneumonia Vaccine (1 of 2 - PCV) Never done   HIV Screening  Never done   Hepatitis C Screening  Never done   DTaP/Tdap/Td vaccine (1 - Tdap) Never done   Colon Cancer Screening  Never done   Zoster (Shingles) Vaccine (1 of 2) Never done   Mammogram  04/14/2014   Pap with HPV screening  05/18/2019   DEXA scan (bone density measurement)  Never done   COVID-19 Vaccine (1 - 2024-25 season) Never done   Flu Shot  05/17/2023*   Medicare Annual Wellness Visit  02/18/2024   HPV Vaccine  Aged Out  *Topic was postponed. The date shown is not the original due date.    Advanced directives: (ACP Link)Information on Advanced Care Planning can be found at Ruth  Secretary of [facility] Advance Health Care Directives Advance Health Care Directives ([email or web address]   Next Medicare Annual Wellness Visit scheduled for next year: Yes

## 2023-02-22 ENCOUNTER — Encounter: Payer: Medicare HMO | Admitting: Family Medicine

## 2023-04-21 ENCOUNTER — Encounter: Payer: Self-pay | Admitting: Family Medicine

## 2023-04-21 ENCOUNTER — Ambulatory Visit: Admitting: Family Medicine

## 2023-04-21 VITALS — BP 138/71 | HR 109 | Temp 97.7°F | Ht 62.0 in | Wt 118.8 lb

## 2023-04-21 DIAGNOSIS — Z20828 Contact with and (suspected) exposure to other viral communicable diseases: Secondary | ICD-10-CM

## 2023-04-21 DIAGNOSIS — R059 Cough, unspecified: Secondary | ICD-10-CM

## 2023-04-21 DIAGNOSIS — R6883 Chills (without fever): Secondary | ICD-10-CM | POA: Diagnosis not present

## 2023-04-21 DIAGNOSIS — R0981 Nasal congestion: Secondary | ICD-10-CM | POA: Diagnosis not present

## 2023-04-21 DIAGNOSIS — R52 Pain, unspecified: Secondary | ICD-10-CM | POA: Diagnosis not present

## 2023-04-21 LAB — VERITOR FLU A/B WAIVED
Influenza A: NEGATIVE
Influenza B: NEGATIVE

## 2023-04-21 MED ORDER — OSELTAMIVIR PHOSPHATE 75 MG PO CAPS: 75.0000 mg | ORAL_CAPSULE | Freq: Two times a day (BID) | ORAL | 0 refills | Status: AC

## 2023-04-21 NOTE — Progress Notes (Signed)
 Subjective:  Patient ID: Susan Baird, female    DOB: 01-16-1956, 68 y.o.   MRN: [id number]  Patient Care Team: Raliegh Ip, DO as PCP - General (Family Medicine) Bjorn Pippin, MD as Attending Physician (Urology)   Chief Complaint:  Headache, Cough, Chills, and Generalized Body Aches (Patient states she started getting sick yesterday. )   HPI: Susan Baird is a 68 y.o. female presenting on 04/21/2023 for Headache, Cough, Chills, and Generalized Body Aches (Patient states she started getting sick yesterday. )   Discussed the use of AI scribe software for clinical note transcription with the patient, who gave verbal consent to proceed.  History of Present Illness   Susan Baird is a 68 year old female who presents with symptoms suggestive of influenza. She has been exposed to influenza through her nephew and her husband.  Her symptoms began last night with a sensation of feeling cold, followed by headache, myalgia, nasal congestion, chest congestion, and a sensation of heaviness and pain in her eyes. She woke up this morning with the full array of symptoms.  She experiences significant coughing, described as congested, but without sputum production. No fever is present, but she has headache, nasal congestion, chest congestion, and eye discomfort.  She has not yet taken any medication to manage her symptoms.          Relevant past medical, surgical, family, and social history reviewed and updated as indicated.  Allergies and medications reviewed and updated. Data reviewed: Chart in Epic.   Past Medical History:  Diagnosis Date   Allergies    Allergy    Kidney stone     Past Surgical History:  Procedure Laterality Date   CESAREAN SECTION     CHOLECYSTECTOMY N/A 04/05/2019   Procedure: LAPAROSCOPIC CHOLECYSTECTOMY WITH INTRAOPERATIVE CHOLANGIOGRAM;  Surgeon: Manus Rudd, MD;  [facility]:  [facility];  Service: General;  Laterality: N/A;   TUBAL  LIGATION      Social History   Socioeconomic History   Marital status: Married    Spouse name: Not on file   Number of children: Not on file   Years of education: Not on file   Highest education level: 12th grade  Occupational History   Not on file  Tobacco Use   Smoking status: Every Day    Current packs/day: 1.00    Types: Cigarettes   Smokeless tobacco: Never  Vaping Use   Vaping status: Never Used  Substance and Sexual Activity   Alcohol use: No   Drug use: No   Sexual activity: Not Currently  Other Topics Concern   Not on file  Social History Narrative   Not on file   Social Drivers of Health   Financial Resource Strain: Patient Declined (02/16/2023)   Overall Financial Resource Strain (CARDIA)    Difficulty of Paying Living Expenses: Patient declined  Food Insecurity: No Food Insecurity (02/16/2023)   Hunger Vital Sign    Worried About Running Out of Food in the Last Year: Never true    Ran Out of Food in the Last Year: Never true  Transportation Needs: No Transportation Needs (02/16/2023)   PRAPARE - Administrator, Civil Service (Medical): No    Lack of Transportation (Non-Medical): No  Physical Activity: Inactive (02/16/2023)   Exercise Vital Sign    Days of Exercise per Week: 0 days    Minutes of Exercise per Session: 0 min  Stress: No Stress Concern Present (02/16/2023)  [employer] of Occupational Health - Occupational Stress Questionnaire    Feeling of Stress : Not at all  Social Connections: Unknown (02/16/2023)   Social Connection and Isolation Panel [NHANES]    Frequency of Communication with Friends and Family: More than three times a week    Frequency of Social Gatherings with Friends and Family: Patient declined    Attends Religious Services: Patient declined    Database administrator or Organizations: No    Attends Banker Meetings: Never    Marital Status: Married  Catering manager Violence: Not At Risk  (02/18/2023)   Humiliation, Afraid, Rape, and Kick questionnaire    Fear of Current or Ex-Partner: No    Emotionally Abused: No    Physically Abused: No    Sexually Abused: No    Outpatient Encounter Medications as of 04/21/2023  Medication Sig   Cholecalciferol (VITAMIN D3) 30 MCG/15ML LIQD Take by mouth.   Cyanocobalamin (VITAMIN B 12 PO) Take by mouth.   ELDERBERRY PO Take by mouth.   fluticasone (FLONASE) 50 MCG/ACT nasal spray Place 1 spray into both nostrils daily.   fluticasone (FLONASE) 50 MCG/ACT nasal spray Place 2 sprays into both nostrils daily.   Magnesium 250 MG TABS Take by mouth.   Multiple Vitamin (MULTIVITAMIN ADULT PO) Take by mouth.   oseltamivir (TAMIFLU) 75 MG capsule Take 1 capsule (75 mg total) by mouth 2 (two) times daily for 5 days.   Potassium 95 MG TABS Take by mouth. Takes 3 times a week OTC   Probiotic Product (PROBIOTIC PO) Take by mouth.   solifenacin (VESICARE) 5 MG tablet Take 1 tablet (5 mg total) by mouth daily.   Facility-Administered Encounter Medications as of 04/21/2023  Medication   ciprofloxacin (CIPRO) tablet 500 mg    No Known Allergies  Pertinent ROS per HPI, otherwise unremarkable      Objective:  BP 138/71   Pulse (!) 109   Temp 97.7 F (36.5 C)   Ht 5\' 2"  (1.575 m)   Wt 118 lb 12.8 oz (53.9 kg)   SpO2 92%   BMI 21.73 kg/m    Wt Readings from Last 3 Encounters:  04/21/23 118 lb 12.8 oz (53.9 kg)  02/18/23 114 lb (51.7 kg)  07/17/22 114 lb 9.6 oz (52 kg)    Physical Exam Vitals and nursing note reviewed.  Constitutional:      General: She is not in acute distress.    Appearance: Normal appearance. She is normal weight. She is ill-appearing. She is not toxic-appearing or diaphoretic.  HENT:     Head: Normocephalic and atraumatic.     Right Ear: A middle ear effusion is present. Tympanic membrane is not erythematous.     Left Ear: A middle ear effusion is present. Tympanic membrane is not erythematous.     Nose:  Congestion present.     Mouth/Throat:     Lips: Pink.     Mouth: Mucous membranes are moist.     Pharynx: Posterior oropharyngeal erythema and postnasal drip present. No pharyngeal swelling, oropharyngeal exudate or uvula swelling.  Cardiovascular:     Rate and Rhythm: Normal rate and regular rhythm.     Heart sounds: Normal heart sounds.  Pulmonary:     Effort: Pulmonary effort is normal.     Breath sounds: Normal breath sounds.  Musculoskeletal:     Cervical back: Neck supple.  Lymphadenopathy:     Cervical: No cervical adenopathy.  Skin:    General:  Skin is warm and dry.     Capillary Refill: Capillary refill takes less than 2 seconds.  Neurological:     General: No focal deficit present.     Mental Status: She is alert and oriented to person, place, and time.  Psychiatric:        Mood and Affect: Mood normal.        Behavior: Behavior normal. Behavior is cooperative.        Thought Content: Thought content normal.        Judgment: Judgment normal.     Results for orders placed or performed in visit on 11/19/22  Microscopic Examination   Collection Time: 11/19/22  2:21 PM   Urine  Result Value Ref Range   WBC, UA 6-10 (A) 0 - 5 /hpf   RBC, Urine 3-10 (A) 0 - 2 /hpf   Epithelial Cells (non renal) 0-10 0 - 10 /hpf   Bacteria, UA Few (A) None seen/Few  Urinalysis, Routine w reflex microscopic   Collection Time: 11/19/22  2:21 PM  Result Value Ref Range   Specific Gravity, UA 1.020 1.005 - 1.030   pH, UA 7.0 5.0 - 7.5   Color, UA Yellow Yellow   Appearance Ur Clear Clear   Leukocytes,UA Trace (A) Negative   Protein,UA Negative Negative/Trace   Glucose, UA Negative Negative   Ketones, UA Negative Negative   RBC, UA 2+ (A) Negative   Bilirubin, UA Negative Negative   Urobilinogen, Ur 0.2 0.2 - 1.0 mg/dL   Nitrite, UA Negative Negative   Microscopic Examination See below:        Pertinent labs & imaging results that were available during my care of the patient  were reviewed by me and considered in my medical decision making.  Assessment & Plan:  Delinda was seen today for headache, cough, chills and generalized body aches.  Diagnoses and all orders for this visit:  Body aches -     Veritor Flu A/B Waived -     oseltamivir (TAMIFLU) 75 MG capsule; Take 1 capsule (75 mg total) by mouth 2 (two) times daily for 5 days. -     COVID-19, Flu A+B and RSV  Chills -     Veritor Flu A/B Waived -     oseltamivir (TAMIFLU) 75 MG capsule; Take 1 capsule (75 mg total) by mouth 2 (two) times daily for 5 days. -     COVID-19, Flu A+B and RSV  Nasal congestion -     Veritor Flu A/B Waived -     oseltamivir (TAMIFLU) 75 MG capsule; Take 1 capsule (75 mg total) by mouth 2 (two) times daily for 5 days. -     COVID-19, Flu A+B and RSV  Cough in adult -     Veritor Flu A/B Waived -     oseltamivir (TAMIFLU) 75 MG capsule; Take 1 capsule (75 mg total) by mouth 2 (two) times daily for 5 days. -     COVID-19, Flu A+B and RSV  Exposure to influenza -     Veritor Flu A/B Waived -     oseltamivir (TAMIFLU) 75 MG capsule; Take 1 capsule (75 mg total) by mouth 2 (two) times daily for 5 days. -     COVID-19, Flu A+B and RSV     Assessment and Plan    Influenza (suspected) Presents with symptoms consistent with influenza, including headache, myalgia, nasal congestion, chest congestion, ocular pain, and cough. Symptoms began yesterday. Exposed to family  members diagnosed with influenza type A. Initial flu test is negative, likely due to early testing. Given exposure and symptomatology, influenza is highly suspected. Discussed Tamiflu prophylaxis to prevent severe illness. Explained symptomatic management with acetaminophen, ibuprofen, fluids, guaifenesin, and dextromethorphan. Informed that the three-in-one test for COVID-19, RSV, and influenza will provide results tomorrow. - Prescribe Tamiflu as prophylaxis - Recommend symptomatic management with acetaminophen,  ibuprofen, fluids, guaifenesin, and dextromethorphan - Order three-in-one test for COVID-19, RSV, and influenza  Follow-up - Await results of the three-in-one test, expected tomorrow.          Continue all other maintenance medications.  Follow up plan: Return if symptoms worsen or fail to improve.   Continue healthy lifestyle choices, including diet (rich in fruits, vegetables, and lean proteins, and low in salt and simple carbohydrates) and exercise (at least 30 minutes of moderate physical activity daily).  Educational handout given for influenza  The above assessment and management plan was discussed with the patient. The patient verbalized understanding of and has agreed to the management plan. Patient is aware to call the clinic if they develop any new symptoms or if symptoms persist or worsen. Patient is aware when to return to the clinic for a follow-up visit. Patient educated on when it is appropriate to go to the emergency department.   Kari Baars, FNP-C [facility] [phone]

## 2023-04-22 LAB — COVID-19, FLU A+B AND RSV
Influenza A, NAA: NOT DETECTED
Influenza B, NAA: NOT DETECTED
RSV, NAA: NOT DETECTED
SARS-CoV-2, NAA: NOT DETECTED

## 2023-05-20 ENCOUNTER — Ambulatory Visit: Payer: Medicare HMO | Admitting: Urology

## 2023-06-10 ENCOUNTER — Ambulatory Visit: Payer: Medicare HMO | Admitting: Urology

## 2023-06-10 VITALS — BP 139/67 | HR 82

## 2023-06-10 DIAGNOSIS — N200 Calculus of kidney: Secondary | ICD-10-CM

## 2023-06-10 DIAGNOSIS — N3941 Urge incontinence: Secondary | ICD-10-CM | POA: Diagnosis not present

## 2023-06-10 DIAGNOSIS — R350 Frequency of micturition: Secondary | ICD-10-CM | POA: Diagnosis not present

## 2023-06-10 LAB — URINALYSIS, ROUTINE W REFLEX MICROSCOPIC
Bilirubin, UA: NEGATIVE
Glucose, UA: NEGATIVE
Ketones, UA: NEGATIVE
Leukocytes,UA: NEGATIVE
Nitrite, UA: NEGATIVE
Protein,UA: NEGATIVE
Specific Gravity, UA: 1.02 (ref 1.005–1.030)
Urobilinogen, Ur: 0.2 mg/dL (ref 0.2–1.0)
pH, UA: 7 (ref 5.0–7.5)

## 2023-06-10 LAB — MICROSCOPIC EXAMINATION

## 2023-06-10 NOTE — Progress Notes (Unsigned)
 Subjective: 1. Urge incontinence   2. Urinary frequency   3. Nephrolithiasis     06/10/23: Susan Baird returns today in f/u.  She is not on any OAB meds now because of constipation and abdominal discomfort.  Her UA has 1+ blood with only 0-2 RBC's on UA today.   Her symptoms depend on her hydration status.  She does better if she restrict fluids.  She has a history of stones but has had no symptoms.  KUB in 11/24 showed a 4mm right renal stone.   11/20/22: Susan Baird returns today in f/u.  She developed blurry vision on vesicare  and was changed to Gem[place]a about 5 days ago.  She had a flair of symptoms about 5 weeks ago after drinking sugar free apple cider.   She stopped that and the symptoms improved.   Her UA today has 3-10 RBC's.  She is doing ok on [place].   05/07/22: Susan Baird returns today in f/u to assess her response to Myrbetriq  for her OAB with UUI.   She also was to use an OTC NSAID for the pelvic pain.  She didn't get a cytology at her last visit and I will consider that today.  Her cystoscopy for the microhematuria was negative.  She had small stones on CT.   She had a response to Myrbetriq  25mg .  She has had no nausea or HA but did have a little transient abdominal pain.  She has reduced frequency and she can hold it better.   She has 3-10 RBC's todays.    2/8/24[name] returns today for cystoscopy.  Her CT showed a 4 non-obstructing RLP renal stone that was 2mm on prior CT and some bladder wall thickening.  She has diverticulosis without inflammation. She is for cystoscopy today.   She still has some urgency and frequency with UUI and some pelvic and low back pain.  UA has 3-10 RBC's.   03/19/22: Susan Baird is a 68 yo female who has been seen in the [place] office in the past for a stones.  She is sent in consultation today for abdominal discomfort with frequency and hematuria with 2+ blood on a dip UA on 03/13/22 but only 0-2 RBC's on micro.  Her last CT in 2016 showed a small RLP stone. In  October she was treated for a UTI with a couple of rounds of antibiotics but after that she began to have some pelvic and lower abdominal pain.   She then started having lower back and flank pain on the right.  She has urgency with UUI if not careful.  She has no increased frequency or nocturia.  Her PVR is 0ml. Her UA has 11-30 RBC's.  She has had mild nausea x 2 in the last 2 months.     ROS:  Review of Systems  Constitutional:        She has some night sweats.     No Known Allergies  Past Medical History:  Diagnosis Date   Allergies    Allergy    Kidney stone     Past Surgical History:  Procedure Laterality Date   CESAREAN SECTION     CHOLECYSTECTOMY N/A 04/05/2019   Procedure: LAPAROSCOPIC CHOLECYSTECTOMY WITH INTRAOPERATIVE CHOLANGIOGRAM;  Surgeon: Dareen Ebbing, MD;  Location: [facility];  Service: General;  Laterality: N/A;   TUBAL LIGATION      Social History   Socioeconomic History   Marital status: Married    Spouse name: Not on file   Number  of children: Not on file   Years of education: Not on file   Highest education level: 12th grade  Occupational History   Not on file  Tobacco Use   Smoking status: Every Day    Current packs/day: 1.00    Types: Cigarettes   Smokeless tobacco: Never  Vaping Use   Vaping status: Never Used  Substance and Sexual Activity   Alcohol use: No   Drug use: No   Sexual activity: Not Currently  Other Topics Concern   Not on file  Social History Narrative   Not on file   Social Drivers of Health   Financial Resource Strain: Patient Declined (02/16/2023)   Overall Financial Resource Strain (CARDIA)    Difficulty of Paying Living Expenses: Patient declined  Food Insecurity: No Food Insecurity (02/16/2023)   Hunger Vital Sign    Worried About Running Out of Food in the Last Year: Never true    Ran Out of Food in the Last Year: Never true  Transportation Needs: No Transportation Needs (02/16/2023)   PRAPARE  - Administrator, Civil Service (Medical): No    Lack of Transportation (Non-Medical): No  Physical Activity: Inactive (02/16/2023)   Exercise Vital Sign    Days of Exercise per Week: 0 days    Minutes of Exercise per Session: 0 min  Stress: No Stress Concern Present (02/16/2023)   [employer] of Occupational Health - Occupational Stress Questionnaire    Feeling of Stress : Not at all  Social Connections: Unknown (02/16/2023)   Social Connection and Isolation Panel [NHANES]    Frequency of Communication with Friends and Family: More than three times a week    Frequency of Social Gatherings with Friends and Family: Patient declined    Attends Religious Services: Patient declined    Database administrator or Organizations: No    Attends Banker Meetings: Never    Marital Status: Married  Catering manager Violence: Not At Risk (02/18/2023)   Humiliation, Afraid, Rape, and Kick questionnaire    Fear of Current or Ex-Partner: No    Emotionally Abused: No    Physically Abused: No    Sexually Abused: No    No family history on file.  Anti-infectives: Anti-infectives (From admission, onward)    None       Current Outpatient Medications  Medication Sig Dispense Refill   Cholecalciferol (VITAMIN D3) 30 MCG/15ML LIQD Take by mouth.     Cyanocobalamin (VITAMIN B 12 PO) Take by mouth.     ELDERBERRY PO Take by mouth.     fluticasone  (FLONASE ) 50 MCG/ACT nasal spray Place 1 spray into both nostrils daily. 1 g 2   fluticasone  (FLONASE ) 50 MCG/ACT nasal spray Place 2 sprays into both nostrils daily. 16 g 6   Magnesium 250 MG TABS Take by mouth.     Multiple Vitamin (MULTIVITAMIN ADULT PO) Take by mouth.     Potassium 95 MG TABS Take by mouth. Takes 3 times a week OTC     Probiotic Product (PROBIOTIC PO) Take by mouth.     solifenacin  (VESICARE ) 5 MG tablet Take 1 tablet (5 mg total) by mouth daily. 30 tablet 11   Current Facility-Administered Medications   Medication Dose Route Frequency Provider Last Rate Last Admin   ciprofloxacin  (CIPRO ) tablet 500 mg  500 mg Oral Once Cordarious Zeek, MD         Objective: Vital signs in last 24 hours: BP 139/67   Pulse  82   Intake/Output from previous day: No intake/output data recorded. Intake/Output this shift: @IOTHISSHIFT @   Physical Exam Vitals reviewed.  Constitutional:      Appearance: Normal appearance.  Neurological:     Mental Status: She is alert.     Lab Results:  Results for orders placed or performed in visit on 06/10/23 (from the past 24 hours)  Urinalysis, Routine w reflex microscopic     Status: Abnormal   Collection Time: 06/10/23 11:06 AM  Result Value Ref Range   Specific Gravity, UA 1.020 1.005 - 1.030   pH, UA 7.0 5.0 - 7.5   Color, UA Yellow Yellow   Appearance Ur Clear Clear   Leukocytes,UA Negative Negative   Protein,UA Negative Negative/Trace   Glucose, UA Negative Negative   Ketones, UA Negative Negative   RBC, UA 1+ (A) Negative   Bilirubin, UA Negative Negative   Urobilinogen, Ur 0.2 0.2 - 1.0 mg/dL   Nitrite, UA Negative Negative   Microscopic Examination See below:    Narrative   Performed at:  [address] - [facility] [address], [place], [place]  [id number] Lab Director: Liliana Regulus MT, Phone:  [phone]  Microscopic Examination     Status: None   Collection Time: 06/10/23 11:06 AM   Urine  Result Value Ref Range   RBC, Urine 0-2 0 - 2 /hpf   Epithelial Cells (non renal) 0-10 0 - 10 /hpf   Bacteria, UA Few None seen/Few   Narrative   Performed at:  [address] - [facility] [address], [place], [place]  [id number] Lab Director: Liliana Regulus MT, Phone:  [phone]      UA reviewed and unremarkable today. Aaron Aas   BMET No results for input(s): "NA", "K", "CL", "CO2", "GLUCOSE", "BUN", "CREATININE", "CALCIUM" in the last 72 hours. PT/INR No results for input(s): "LABPROT", "INR" in the last 72 hours. ABG No results for  input(s): "PHART", "HCO3" in the last 72 hours.  Invalid input(s): "PCO2", "PO2"  Studies/Results: No results found.  No results found. KUB results reviewed.    Assessment/Plan: Microhematuria with urgency, frequency and UUI.  She is off meds and is doing better with fluid management.  Her UA today is unremarkable.   Pelvic pain.  The pain has resolved.   Right renal stone.  The stone was 4mm on KUB in 11/24.   She will return in a year for a KUB.   No orders of the defined types were placed in this encounter.     Orders Placed This Encounter  Procedures   Microscopic Examination   Urinalysis, Routine w reflex microscopic     Return in about 1 year (around 06/09/2024) for wants to be seen in [place].  I will let the office know. .    CC: Tommas Fragmin FNP.     Beryl Hornberger 06/11/2023

## 2023-06-11 ENCOUNTER — Encounter: Payer: Self-pay | Admitting: Urology

## 2023-06-16 ENCOUNTER — Ambulatory Visit (INDEPENDENT_AMBULATORY_CARE_PROVIDER_SITE_OTHER): Payer: Medicare HMO | Admitting: Family Medicine

## 2023-06-16 ENCOUNTER — Ambulatory Visit
Admission: RE | Admit: 2023-06-16 | Discharge: 2023-06-16 | Disposition: A | Discharge status: Home / Self Care | Payer: Medicare HMO | Source: Ambulatory Visit | Attending: Family Medicine | Admitting: Family Medicine

## 2023-06-16 ENCOUNTER — Encounter: Payer: Self-pay | Admitting: Family Medicine

## 2023-06-16 VITALS — BP 125/61 | HR 73 | Temp 98.6°F | Ht 62.0 in | Wt 121.8 lb

## 2023-06-16 DIAGNOSIS — Z0001 Encounter for general adult medical examination with abnormal findings: Secondary | ICD-10-CM

## 2023-06-16 DIAGNOSIS — Z2821 Immunization not carried out because of patient refusal: Secondary | ICD-10-CM | POA: Diagnosis not present

## 2023-06-16 DIAGNOSIS — Z13220 Encounter for screening for lipoid disorders: Secondary | ICD-10-CM

## 2023-06-16 DIAGNOSIS — J302 Other seasonal allergic rhinitis: Secondary | ICD-10-CM

## 2023-06-16 DIAGNOSIS — Z0000 Encounter for general adult medical examination without abnormal findings: Secondary | ICD-10-CM

## 2023-06-16 DIAGNOSIS — Z720 Tobacco use: Secondary | ICD-10-CM | POA: Diagnosis not present

## 2023-06-16 DIAGNOSIS — N3010 Interstitial cystitis (chronic) without hematuria: Secondary | ICD-10-CM | POA: Diagnosis not present

## 2023-06-16 DIAGNOSIS — Z87442 Personal history of urinary calculi: Secondary | ICD-10-CM | POA: Diagnosis not present

## 2023-06-16 DIAGNOSIS — Z5320 Procedure and treatment not carried out because of patient's decision for unspecified reasons: Secondary | ICD-10-CM | POA: Diagnosis not present

## 2023-06-16 DIAGNOSIS — Z1231 Encounter for screening mammogram for malignant neoplasm of breast: Secondary | ICD-10-CM

## 2023-06-16 LAB — LIPID PANEL

## 2023-06-16 MED ORDER — ALBUTEROL SULFATE HFA 108 (90 BASE) MCG/ACT IN AERS
2.0000 | INHALATION_SPRAY | Freq: Four times a day (QID) | RESPIRATORY_TRACT | 0 refills | Status: DC | PRN
Start: 2023-06-16 — End: 2023-10-07

## 2023-06-16 MED ORDER — DESLORATADINE 5 MG PO TABS: 5.0000 mg | ORAL_TABLET | Freq: Every day | ORAL | 4 refills | Status: AC

## 2023-06-16 NOTE — Patient Instructions (Addendum)
 Preventive Care 43 Years and Older, Female Preventive care refers to lifestyle choices and visits with your health care provider that can promote health and wellness. Preventive care visits are also called wellness exams. What can I expect for my preventive care visit? Counseling Your health care provider may ask you questions about your: Medical history, including: Past medical problems. Family medical history. Pregnancy and menstrual history. History of falls. Current health, including: Memory and ability to understand (cognition). Emotional well-being. Home life and relationship well-being. Sexual activity and sexual health. Lifestyle, including: Alcohol, nicotine or tobacco, and drug use. Access to firearms. Diet, exercise, and sleep habits. Work and work Astronomer. Sunscreen use. Safety issues such as seatbelt and bike helmet use. Physical exam Your health care provider will check your: Height and weight. These may be used to calculate your BMI (body mass index). BMI is a measurement that tells if you are at a healthy weight. Waist circumference. This measures the distance around your waistline. This measurement also tells if you are at a healthy weight and may help predict your risk of certain diseases, such as type 2 diabetes and high blood pressure. Heart rate and blood pressure. Body temperature. Skin for abnormal spots. What immunizations do I need?  Vaccines are usually given at various ages, according to a schedule. Your health care provider will recommend vaccines for you based on your age, medical history, and lifestyle or other factors, such as travel or where you work. What tests do I need? Screening Your health care provider may recommend screening tests for certain conditions. This may include: Lipid and cholesterol levels. Hepatitis C test. Hepatitis B test. HIV (human immunodeficiency virus) test. STI (sexually transmitted infection) testing, if you are at  risk. Lung cancer screening. Colorectal cancer screening. Diabetes screening. This is done by checking your blood sugar (glucose) after you have not eaten for a while (fasting). Mammogram. Talk with your health care provider about how often you should have regular mammograms. BRCA-related cancer screening. This may be done if you have a family history of breast, ovarian, tubal, or peritoneal cancers. Bone density scan. This is done to screen for osteoporosis. Talk with your health care provider about your test results, treatment options, and if necessary, the need for more tests. Follow these instructions at home: Eating and drinking  Eat a diet that includes fresh fruits and vegetables, whole grains, lean protein, and low-fat dairy products. Limit your intake of foods with high amounts of sugar, saturated fats, and salt. Take vitamin and mineral supplements as recommended by your health care provider. Do not drink alcohol if your health care provider tells you not to drink. If you drink alcohol: Limit how much you have to 0-1 drink a day. Know how much alcohol is in your drink. In the [place] one drink equals one 12 oz bottle of beer (355 mL), one 5 oz glass of wine (148 mL), or one 1 oz glass of hard liquor (44 mL). Lifestyle Brush your teeth every morning and night with fluoride toothpaste. Floss one time each day. Exercise for at least 30 minutes 5 or more days each week. Do not use any products that contain nicotine or tobacco. These products include cigarettes, chewing tobacco, and vaping devices, such as e-cigarettes. If you need help quitting, ask your health care provider. Do not use drugs. If you are sexually active, practice safe sex. Use a condom or other form of protection in order to prevent STIs. Take aspirin only as told by  your health care provider. Make sure that you understand how much to take and what form to take. Work with your health care provider to find out whether it  is safe and beneficial for you to take aspirin daily. Ask your health care provider if you need to take a cholesterol-lowering medicine (statin). Find healthy ways to manage stress, such as: Meditation, yoga, or listening to music. Journaling. Talking to a trusted person. Spending time with friends and family. Minimize exposure to UV radiation to reduce your risk of skin cancer. Safety Always wear your seat belt while driving or riding in a vehicle. Do not drive: If you have been drinking alcohol. Do not ride with someone who has been drinking. When you are tired or distracted. While texting. If you have been using any mind-altering substances or drugs. Wear a helmet and other protective equipment during sports activities. If you have firearms in your house, make sure you follow all gun safety procedures. What's next? Visit your health care provider once a year for an annual wellness visit. Ask your health care provider how often you should have your eyes and teeth checked. Stay up to date on all vaccines. This information is not intended to replace advice given to you by your health care provider. Make sure you discuss any questions you have with your health care provider. Document Revised: 07/31/2020 Document Reviewed: 07/31/2020 Elsevier Patient Education  2024 [employer].

## 2023-06-16 NOTE — Progress Notes (Signed)
 Susan Baird is a 68 y.o. female presents to office today for annual physical exam examination.    Concerns today include: 1.  Sinus congestion Patient reports persistent sinus and chest congestion following influenza.  She notes she actually was not treated for influenza despite her has been having been tested and confirmed positive for flu A.  She really has not ever had resolution but denies any hemoptysis, brown sputum.  All her mucus has been clear.  She utilizes Zyrtec in the morning and Xyzal  in the evening with occasional Flonase  and nasal saline.  She is an everyday smoker but has reduced from 1 pack/day to 8 to 10 cigarettes/day as of earlier this year.  She is been a smoker for over 40 years.  No chest pain.  She reports occasional improvement in symptoms with albuterol  inhaler.  Does not wish to have any lung cancer screening  Marital status: married, Substance use: tobacco Health Maintenance Due  Topic Date Due   MAMMOGRAM  04/14/2014   Refills needed today: none   There is no immunization history on file for this patient. Past Medical History:  Diagnosis Date   Allergies    Allergy    Kidney stone    Social History   Socioeconomic History   Marital status: Married    Spouse name: Not on file   Number of children: Not on file   Years of education: Not on file   Highest education level: 12th grade  Occupational History   Not on file  Tobacco Use   Smoking status: Every Day    Current packs/day: 0.25    Average packs/day: 1 pack/day for 40.3 years (40.1 ttl pk-yrs)    Types: Cigarettes    Start date: 61   Smokeless tobacco: Never  Vaping Use   Vaping status: Never Used  Substance and Sexual Activity   Alcohol use: No   Drug use: No   Sexual activity: Not Currently  Other Topics Concern   Not on file  Social History Narrative   Not on file   Social Drivers of Health   Financial Resource Strain: High Risk (06/16/2023)   Overall Financial Resource  Strain (CARDIA)    Difficulty of Paying Living Expenses: Very hard  Food Insecurity: No Food Insecurity (06/16/2023)   Hunger Vital Sign    Worried About Running Out of Food in the Last Year: Never true    Ran Out of Food in the Last Year: Never true  Transportation Needs: No Transportation Needs (06/16/2023)   PRAPARE - Administrator, Civil Service (Medical): No    Lack of Transportation (Non-Medical): No  Physical Activity: Inactive (06/16/2023)   Exercise Vital Sign    Days of Exercise per Week: 0 days    Minutes of Exercise per Session: 0 min  Stress: No Stress Concern Present (06/16/2023)   [employer] of Occupational Health - Occupational Stress Questionnaire    Feeling of Stress : Not at all  Social Connections: Moderately Integrated (06/16/2023)   Social Connection and Isolation Panel [NHANES]    Frequency of Communication with Friends and Family: More than three times a week    Frequency of Social Gatherings with Friends and Family: Three times a week    Attends Religious Services: More than 4 times per year    Active Member of Clubs or Organizations: No    Attends Banker Meetings: Never    Marital Status: Married  Catering manager Violence: Not  At Risk (02/18/2023)   Humiliation, Afraid, Rape, and Kick questionnaire    Fear of Current or Ex-Partner: No    Emotionally Abused: No    Physically Abused: No    Sexually Abused: No   Past Surgical History:  Procedure Laterality Date   CESAREAN SECTION     CHOLECYSTECTOMY N/A 04/05/2019   Procedure: LAPAROSCOPIC CHOLECYSTECTOMY WITH INTRAOPERATIVE CHOLANGIOGRAM;  Surgeon: Dareen Ebbing, MD;  Location: [facility];  Service: General;  Laterality: N/A;   TUBAL LIGATION     History reviewed. No pertinent family history.  Current Outpatient Medications:    albuterol  (VENTOLIN  HFA) 108 (90 Base) MCG/ACT inhaler, Inhale 2 puffs into the lungs every 6 (six) hours as needed for wheezing or  shortness of breath., Disp: 8 g, Rfl: 0   Cholecalciferol (VITAMIN D3) 30 MCG/15ML LIQD, Take by mouth., Disp: , Rfl:    Cyanocobalamin (VITAMIN B 12 PO), Take by mouth., Disp: , Rfl:    desloratadine (CLARINEX) 5 MG tablet, Take 1 tablet (5 mg total) by mouth daily., Disp: 90 tablet, Rfl: 4   ELDERBERRY PO, Take by mouth., Disp: , Rfl:    fluticasone  (FLONASE ) 50 MCG/ACT nasal spray, Place 1 spray into both nostrils daily., Disp: 1 g, Rfl: 2   fluticasone  (FLONASE ) 50 MCG/ACT nasal spray, Place 2 sprays into both nostrils daily., Disp: 16 g, Rfl: 6   Magnesium 250 MG TABS, Take by mouth., Disp: , Rfl:    Multiple Vitamin (MULTIVITAMIN ADULT PO), Take by mouth., Disp: , Rfl:    Potassium 95 MG TABS, Take by mouth. Takes 3 times a week OTC, Disp: , Rfl:    Probiotic Product (PROBIOTIC PO), Take by mouth., Disp: , Rfl:   No Known Allergies   ROS: Review of Systems Pertinent items noted in HPI and remainder of comprehensive ROS otherwise negative.    Physical exam BP 125/61   Pulse 73   Temp 98.6 F (37 C)   Ht 5\' 2"  (1.575 m)   Wt 121 lb 12.8 oz (55.2 kg)   SpO2 98%   BMI 22.28 kg/m  General appearance: alert, cooperative, appears stated age, and no distress Head: Normocephalic, without obvious abnormality, atraumatic Eyes: negative findings: lids and lashes normal, conjunctivae and sclerae normal, corneas clear, and pupils equal, round, reactive to light and accomodation Ears: normal TM's and external ear canals both ears Nose: Nares normal. Septum midline. Mucosa normal.  Scant clear drainage   Throat: lips, mucosa, and tongue normal; teeth and gums normal Neck: no adenopathy, supple, symmetrical, trachea midline, and thyroid not enlarged, symmetric, no tenderness/mass/nodules Back: symmetric, no curvature. ROM normal. No CVA tenderness. Lungs: clear to auscultation bilaterally Heart: regular rate and rhythm, S1, S2 normal, no murmur, click, rub or gallop Abdomen: soft,  non-tender; bowel sounds normal; no masses,  no organomegaly Extremities: extremities normal, atraumatic, no cyanosis or edema Pulses: 2+ and symmetric Skin: Skin color, texture, turgor normal. No rashes or lesions Lymph nodes: Cervical, supraclavicular, and axillary nodes normal. Neurologic: Grossly normal      06/16/2023    9:00 AM 02/18/2023    9:08 AM 06/30/2022    8:14 AM  Depression screen PHQ 2/9  Decreased Interest 0 0 1  Down, Depressed, Hopeless 0 0 0  PHQ - 2 Score 0 0 1  Altered sleeping 0  1  Tired, decreased energy 0  1  Change in appetite 0  0  Feeling bad or failure about yourself  0  0  Trouble concentrating 0  0  Moving slowly or fidgety/restless 0  0  Suicidal thoughts 0  0  PHQ-9 Score 0  3  Difficult doing work/chores Not difficult at all  Not difficult at all      06/16/2023    9:00 AM 06/30/2022    8:15 AM 01/02/2022   10:24 AM  GAD 7 : Generalized Anxiety Score  Nervous, Anxious, on Edge 0 0 0  Control/stop worrying 0 0 0  Worry too much - different things 0 0 0  Trouble relaxing 0 0 0  Restless 0 0 0  Easily annoyed or irritable 0 0 0  Afraid - awful might happen 0 0 0  Total GAD 7 Score 0 0 0  Anxiety Difficulty Not difficult at all Not difficult at all Not difficult at all     Assessment/ Plan: Drue Gerald Stan here for annual physical exam.   Annual physical exam  Seasonal allergies - Plan: CMP14+EGFR, desloratadine (CLARINEX) 5 MG tablet, albuterol  (VENTOLIN  HFA) 108 (90 Base) MCG/ACT inhaler  Interstitial cystitis  History of kidney stones - Plan: CMP14+EGFR  Tobacco abuse - Plan: CBC, albuterol  (VENTOLIN  HFA) 108 (90 Base) MCG/ACT inhaler  Screening, lipid - Plan: Lipid Panel  Colon cancer screening declined  Lung cancer screening declined by patient  Vaccination declined  All vaccines and cancer screenings except for mammography have been declined by patient.  I have changed her antihistamine regimen to add Clarinex and as  needed Ventolin .  We discussed the risks that smoking continues to pose to her and she is actively weaning  She will continue to follow-up with urology for IC and renal stone.  Check renal function, CBC  Fasting lipid collected  Counseled on healthy lifestyle choices, including diet (rich in fruits, vegetables and lean meats and low in salt and simple carbohydrates) and exercise (at least 30 minutes of moderate physical activity daily).  Patient to follow up 1 year for CPE  Reyansh Kushnir M. Bonnell Butcher, DO

## 2023-06-17 LAB — CMP14+EGFR
ALT: 12 IU/L (ref 0–32)
AST: 18 IU/L (ref 0–40)
Albumin: 4.4 g/dL (ref 3.9–4.9)
Alkaline Phosphatase: 116 IU/L (ref 44–121)
BUN/Creatinine Ratio: 28 (ref 12–28)
BUN: 17 mg/dL (ref 8–27)
Bilirubin Total: 0.4 mg/dL (ref 0.0–1.2)
CO2: 25 mmol/L (ref 20–29)
Calcium: 9.4 mg/dL (ref 8.7–10.3)
Chloride: 105 mmol/L (ref 96–106)
Creatinine, Ser: 0.6 mg/dL (ref 0.57–1.00)
Globulin, Total: 2.1 g/dL (ref 1.5–4.5)
Glucose: 83 mg/dL (ref 70–99)
Potassium: 4.6 mmol/L (ref 3.5–5.2)
Sodium: 143 mmol/L (ref 134–144)
Total Protein: 6.5 g/dL (ref 6.0–8.5)
eGFR: 99 mL/min/{1.73_m2} (ref >59)

## 2023-06-17 LAB — LIPID PANEL
Cholesterol, Total: 193 mg/dL (ref 100–199)
HDL: 60 mg/dL (ref >39)
LDL CALC COMMENT:: 3.2 ratio (ref 0.0–4.4)
LDL Chol Calc (NIH): 115 mg/dL — ABNORMAL HIGH (ref 0–99)
Triglycerides: 101 mg/dL (ref 0–149)
VLDL Cholesterol Cal: 18 mg/dL (ref 5–40)

## 2023-06-17 LAB — CBC
Hematocrit: 42.4 % (ref 34.0–46.6)
Hemoglobin: 13.9 g/dL (ref 11.1–15.9)
MCH: 30 pg (ref 26.6–33.0)
MCHC: 32.8 g/dL (ref 31.5–35.7)
MCV: 92 fL (ref 79–97)
Platelets: 287 10*3/uL (ref 150–450)
RBC: 4.63 x10E6/uL (ref 3.77–5.28)
RDW: 12.7 % (ref 11.7–15.4)
WBC: 9.1 10*3/uL (ref 3.4–10.8)

## 2023-06-21 ENCOUNTER — Other Ambulatory Visit: Payer: Self-pay | Admitting: Family Medicine

## 2023-06-21 DIAGNOSIS — R928 Other abnormal and inconclusive findings on diagnostic imaging of breast: Secondary | ICD-10-CM

## 2023-07-01 ENCOUNTER — Ambulatory Visit
Admission: RE | Admit: 2023-07-01 | Discharge: 2023-07-01 | Disposition: A | Discharge status: Home / Self Care | Source: Ambulatory Visit | Attending: Family Medicine

## 2023-07-01 DIAGNOSIS — R928 Other abnormal and inconclusive findings on diagnostic imaging of breast: Secondary | ICD-10-CM

## 2023-07-01 DIAGNOSIS — N6325 Unspecified lump in the left breast, overlapping quadrants: Secondary | ICD-10-CM | POA: Diagnosis not present

## 2023-07-20 DIAGNOSIS — H2513 Age-related nuclear cataract, bilateral: Secondary | ICD-10-CM | POA: Diagnosis not present

## 2023-07-20 DIAGNOSIS — H40031 Anatomical narrow angle, right eye: Secondary | ICD-10-CM | POA: Diagnosis not present

## 2023-08-26 ENCOUNTER — Ambulatory Visit: Payer: Self-pay

## 2023-08-26 NOTE — Telephone Encounter (Signed)
 FYI Only or Action Required?: Action required by provider: request for appointment.  Patient was last seen in primary care on 06/16/2023 by Jolinda Norene HERO, DO.  Called Nurse Triage reporting Breast Problem.  Symptoms began a week ago.  Interventions attempted: Nothing.  Symptoms are: left breast warmth/redness/swelling/pain gradually worsening.  Triage Disposition: See Physician Within 24 Hours  Patient/caregiver understands and will follow disposition?: No, wishes to speak with PCP- refused appointments tomorrow with available providers, requesting to see PCP.             Reason for Disposition  [1] Breast looks infected (spreading redness, feels hot or painful to touch) AND [2] no fever  Answer Assessment - Initial Assessment Questions 1. SYMPTOM: What's the main symptom you're concerned about?  (e.g., lump, nipple discharge, pain, rash)     Patient states she has known about a cyst on her left breast for over a year, she states in the past week she has been having swelling/redness/pain/warmth.  2. LOCATION: Where is the symptoms located?     Left breast, left side.  3. ONSET: When did symptoms  start?     X1 week or less.  4. PRIOR HISTORY: Do you have any history of prior problems with your breasts? (e.g., breast cancer, breast implant, fibrocystic breast disease)     Cyst found on mammogram in April 2025.  5. CAUSE: What do you think is causing this symptom?     She states she has  known cyst in the breast.  6. OTHER SYMPTOMS: Do you have any other symptoms? (e.g., breast pain, fever, nipple discharge, redness or rash)     Patient states she has not felt feverish or checked her temperature, nipple discharge.  7. PREGNANCY-BREASTFEEDING: Is there any chance you are pregnant? When was your last menstrual period? Are you breastfeeding?     N/A.  Protocols used: Breast Symptoms-A-AH

## 2023-08-26 NOTE — Telephone Encounter (Signed)
 Duplicate encounter. Please see other nurse triage encounter.

## 2023-08-31 ENCOUNTER — Encounter: Payer: Self-pay | Admitting: Family Medicine

## 2023-08-31 ENCOUNTER — Ambulatory Visit: Admitting: Family Medicine

## 2023-08-31 ENCOUNTER — Telehealth: Payer: Self-pay | Admitting: Family Medicine

## 2023-08-31 VITALS — BP 112/54 | HR 82 | Temp 98.5°F | Ht 59.0 in | Wt 119.0 lb

## 2023-08-31 DIAGNOSIS — N611 Abscess of the breast and nipple: Secondary | ICD-10-CM | POA: Diagnosis not present

## 2023-08-31 MED ORDER — CEFTRIAXONE SODIUM 1 G IJ SOLR
1.0000 g | Freq: Once | INTRAMUSCULAR | Status: AC
  Administered 2023-08-31: 1 g via INTRAMUSCULAR

## 2023-08-31 MED ORDER — DOXYCYCLINE HYCLATE 100 MG PO TABS
100.0000 mg | ORAL_TABLET | Freq: Two times a day (BID) | ORAL | 0 refills | Status: AC
Start: 2023-08-31 — End: 2023-09-10

## 2023-08-31 NOTE — Patient Instructions (Addendum)
 Start Doxycycline  with supper tonight.  Skin Abscess  A skin abscess is an infected spot of skin. It can have pus in it. An abscess can happen in any part of your body. Some abscesses break open (rupture) on their own. Most keep getting worse unless they are treated. If your abscess is not treated, the infection can spread deeper into your body and blood. This can make you feel sick. What are the causes? Germs that enter your skin. This may happen if you have: A cut or scrape. A wound from a needle or an insect bite. Blocked oil or sweat glands. A problem with the spot where your hair goes into your skin. A fluid-filled sac called a cyst under your skin. What increases the risk? Having problems with how your blood moves through your body. Having a weak body defense system (immune system). Having diabetes. Having dry and irritated skin. Needing to get shots often. Putting drugs into your body with a needle. Having a splinter or something else in your skin. Smoking. What are the signs or symptoms? A firm bump under your skin that hurts. A bump with pus at the top. Redness and swelling. Warm or tender spots. A sore on the skin. How is this treated? You may need to: Put a heat pack or a warm, wet washcloth on the spot. Have the pus drained. Take antibiotics. Follow these instructions at home: Medicines Take over-the-counter and prescription medicines only as told by your doctor. If you were prescribed antibiotics, take them as told by your doctor. Do not stop taking them even if you start to feel better. Abscess care  If you have an abscess that has not drained, put heat on it. Use the heat source that your doctor recommends, such as a moist heat pack or a heating pad. Place a towel between your skin and the heat source. Leave the heat on for 20-30 minutes. If your skin turns bright red, take off the heat right away to prevent burns. The risk of burns is higher if you cannot  feel pain, heat, or cold. Follow instructions from your doctor about how to take care of your abscess. Make sure you: Cover the abscess with a bandage. Wash your hands with soap and water for at least 20 seconds before and after you change your bandage. If you cannot use soap and water, use Shaub sanitizer. Change your bandage as told by your doctor. Check your abscess every day for signs that the infection is getting worse. Check for: More redness, swelling, or pain. More fluid or blood. Warmth. More pus or a worse smell. General instructions To keep the infection from spreading: Do not share personal items or towels. Do not go in a hot tub with others. Avoid making skin contact with others. Be careful when you get rid of used bandages or any pus from the abscess. Do not smoke or use any products that contain nicotine or tobacco. If you need help quitting, ask your doctor. Contact a doctor if: You see red streaks on your skin near the abscess. You have any signs of worse infection. You vomit every time you eat or drink. You have a fever, chills, or muscle aches. The cyst or abscess comes back. Get help right away if: You have very bad pain. You make less pee (urine) than normal. This information is not intended to replace advice given to you by your health care provider. Make sure you discuss any questions you have with your  health care provider. Document Revised: 09/17/2021 Document Reviewed: 09/17/2021 Elsevier Patient Education  2024 [employer].

## 2023-08-31 NOTE — Telephone Encounter (Signed)
 Copied from CRM [id number]. Topic: Referral - Status >> Aug 31, 2023  4:30 PM Zebedee SAUNDERS wrote: Reason for CRM: Pt calling regarding referral# [id number], please direct to Estefana Deborah Fritter, DO, [facility] [address] [place], [place], [place] [address] [phone]. They are [facility].

## 2023-08-31 NOTE — Progress Notes (Signed)
 Subjective: CC: Breast cyst PCP: Jolinda Norene HERO, DO YEP:Susan Baird is a 68 y.o. female presenting to clinic today for:  1.  Breast cyst Patient had imaging done in May that demonstrated sebaceous cyst of left breast at the 3 o'clock position measuring 2 cm.  She reports shortly after that she had increased hyperemia and then later moderate to severe soft tissue swelling with central punctum.  She subsequently has had drainage from several parts of the breast now where this lesion is and was able to extract quite a bit of yellow, foul-smelling fluid.  She reports warmth, tenderness.   ROS: Per HPI  No Known Allergies Past Medical History:  Diagnosis Date   Allergies    Allergy    Kidney stone     Current Outpatient Medications:    albuterol  (VENTOLIN  HFA) 108 (90 Base) MCG/ACT inhaler, Inhale 2 puffs into the lungs every 6 (six) hours as needed for wheezing or shortness of breath., Disp: 8 g, Rfl: 0   Cholecalciferol (VITAMIN D3) 30 MCG/15ML LIQD, Take by mouth., Disp: , Rfl:    Cyanocobalamin (VITAMIN B 12 PO), Take by mouth., Disp: , Rfl:    desloratadine  (CLARINEX ) 5 MG tablet, Take 1 tablet (5 mg total) by mouth daily., Disp: 90 tablet, Rfl: 4   ELDERBERRY PO, Take by mouth., Disp: , Rfl:    fluticasone  (FLONASE ) 50 MCG/ACT nasal spray, Place 1 spray into both nostrils daily., Disp: 1 g, Rfl: 2   fluticasone  (FLONASE ) 50 MCG/ACT nasal spray, Place 2 sprays into both nostrils daily., Disp: 16 g, Rfl: 6   Magnesium 250 MG TABS, Take by mouth., Disp: , Rfl:    Multiple Vitamin (MULTIVITAMIN ADULT PO), Take by mouth., Disp: , Rfl:    Potassium 95 MG TABS, Take by mouth. Takes 3 times a week OTC, Disp: , Rfl:    Probiotic Product (PROBIOTIC PO), Take by mouth., Disp: , Rfl:  Social History   Socioeconomic History   Marital status: Married    Spouse name: Not on file   Number of children: Not on file   Years of education: Not on file   Highest education level: 12th  grade  Occupational History   Not on file  Tobacco Use   Smoking status: Every Day    Current packs/day: 0.25    Average packs/day: 1 pack/day for 40.5 years (40.2 ttl pk-yrs)    Types: Cigarettes    Start date: 56   Smokeless tobacco: Never  Vaping Use   Vaping status: Never Used  Substance and Sexual Activity   Alcohol use: No   Drug use: No   Sexual activity: Not Currently  Other Topics Concern   Not on file  Social History Narrative   Not on file   Social Drivers of Health   Financial Resource Strain: Patient Declined (08/29/2023)   Overall Financial Resource Strain (CARDIA)    Difficulty of Paying Living Expenses: Patient declined  Recent Concern: Financial Resource Strain - High Risk (06/16/2023)   Overall Financial Resource Strain (CARDIA)    Difficulty of Paying Living Expenses: Very hard  Food Insecurity: Unknown (08/29/2023)   Hunger Vital Sign    Worried About Running Out of Food in the Last Year: Patient declined    Ran Out of Food in the Last Year: Not on file  Transportation Needs: No Transportation Needs (06/16/2023)   PRAPARE - Administrator, Civil Service (Medical): No    Lack of Transportation (Non-Medical):  No  Physical Activity: Inactive (08/29/2023)   Exercise Vital Sign    Days of Exercise per Week: 0 days    Minutes of Exercise per Session: Not on file  Stress: No Stress Concern Present (08/29/2023)   [employer] of Occupational Health - Occupational Stress Questionnaire    Feeling of Stress: Not at all  Social Connections: Unknown (08/29/2023)   Social Connection and Isolation Panel    Frequency of Communication with Friends and Family: Patient declined    Frequency of Social Gatherings with Friends and Family: Patient declined    Attends Religious Services: Patient declined    Database administrator or Organizations: Patient declined    Attends Banker Meetings: Not on file    Marital Status: Patient declined   Intimate Partner Violence: Not At Risk (02/18/2023)   Humiliation, Afraid, Rape, and Kick questionnaire    Fear of Current or Ex-Partner: No    Emotionally Abused: No    Physically Abused: No    Sexually Abused: No   Family History  Problem Relation Age of Onset   Breast cancer Neg Hx     Objective: Office vital signs reviewed. BP (!) 112/54   Pulse 82   Temp 98.5 F (36.9 C)   Ht 4' 11 (1.499 m)   Wt 119 lb (54 kg)   SpO2 90%   BMI 24.04 kg/m   Physical Examination:  General: Awake, alert, nontoxic female, No acute distress Breast: Left breast with moderate amounts of induration, erythema and warmth.  She has several punctum's along the lesion but no active drainage or fluctuance       Assessment/ Plan: 68 y.o. female   Breast abscess of female - Plan: cefTRIAXone  (ROCEPHIN ) injection 1 g, doxycycline  (VIBRA -TABS) 100 MG tablet, Ambulatory referral to General Surgery  Certainly consistent with infected sebaceous cyst/abscess.  I encouraged her to continue hot compresses in efforts to promote drainage.  I have given her a dose of ceftriaxone  and placed on oral doxycycline  and urgent referral to general versus breast surgery placed for excision of this cyst.  We discussed red flag signs and symptoms warranting further evaluation she voiced good understanding   Susan Baird CHRISTELLA Fielding, DO [facility] [phone]

## 2023-09-01 ENCOUNTER — Telehealth: Payer: Self-pay

## 2023-09-01 NOTE — Telephone Encounter (Signed)
 Copied from CRM [id number]. Topic: Referral - Status >> Sep 01, 2023 11:25 AM Delon DASEN wrote: Reason for CRM: Patient calling for status of referral for surgeon- patient would like a phone call [phone]

## 2023-09-02 ENCOUNTER — Ambulatory Visit: Admitting: Family Medicine

## 2023-09-02 ENCOUNTER — Telehealth: Payer: Self-pay

## 2023-09-02 ENCOUNTER — Ambulatory Visit: Payer: Self-pay

## 2023-09-02 VITALS — BP 117/55 | HR 102 | Temp 97.9°F | Ht 59.0 in | Wt 117.0 lb

## 2023-09-02 DIAGNOSIS — N611 Abscess of the breast and nipple: Secondary | ICD-10-CM | POA: Diagnosis not present

## 2023-09-02 NOTE — Telephone Encounter (Signed)
 Copied from CRM [id number]. Topic: Clinical - Medical Advice >> Sep 02, 2023 11:47 AM Delon T wrote: Reason for CRM: cyst is draining but leaving big hole, is that normal? Please call [phone]

## 2023-09-02 NOTE — Progress Notes (Signed)
 Subjective:  Patient ID: Susan Baird, female    DOB: 04-24-1955  Age: 68 y.o. MRN: [id number]  CC: Abscess (Seen by G. Left breast abscess looks worse then a few days ago. Large whole there. On the side of her breast and very tender. Diarrhea since starting abx. )   HPI Susan Baird presents for swelling erythema and pain with drainage from the left breast at 4:00 two thirds from the nipple to the periphery very painful present for several days.  Recently started on antibiotic     08/31/2023    2:49 PM 06/16/2023    9:00 AM 02/18/2023    9:08 AM  Depression screen PHQ 2/9  Decreased Interest 0 0 0  Down, Depressed, Hopeless 0 0 0  PHQ - 2 Score 0 0 0  Altered sleeping 0 0   Tired, decreased energy 0 0   Change in appetite 0 0   Feeling bad or failure about yourself  0 0   Trouble concentrating 0 0   Moving slowly or fidgety/restless 0 0   Suicidal thoughts 0 0   PHQ-9 Score 0 0   Difficult doing work/chores Not difficult at all Not difficult at all     History Susan Baird has a past medical history of Allergies, Allergy, and Kidney stone.   She has a past surgical history that includes Cesarean section; Tubal ligation; and Cholecystectomy (N/A, 04/05/2019).   Her family history is not on file.She reports that she has been smoking cigarettes. She started smoking about 40 years ago. She has a 40.2 pack-year smoking history. She has never used smokeless tobacco. She reports that she does not drink alcohol and does not use drugs.    ROS Review of Systems  Objective:  BP (!) 117/55   Pulse (!) 102   Temp 97.9 F (36.6 C)   Ht 4' 11 (1.499 m)   Wt 117 lb (53.1 kg)   SpO2 95%   BMI 23.63 kg/m   BP Readings from Last 3 Encounters:  09/02/23 (!) 117/55  08/31/23 (!) 112/54  06/16/23 125/61    Wt Readings from Last 3 Encounters:  09/02/23 117 lb (53.1 kg)  08/31/23 119 lb (54 kg)  06/16/23 121 lb 12.8 oz (55.2 kg)     Physical Exam Constitutional:      General: She  is not in acute distress.    Appearance: She is well-developed.  Cardiovascular:     Rate and Rhythm: Normal rate and regular rhythm.  Pulmonary:     Breath sounds: Normal breath sounds.  Musculoskeletal:        General: Normal range of motion.  Skin:    General: Skin is warm and dry.     Findings: Lesion (6 mm denuded epithelium with ulceration at 4:00 on the left breast.  This is oozing pus and sebum.) present.  Neurological:     Mental Status: She is alert and oriented to person, place, and time.   .The pt was prepped with betadine and draped in sterile fashion. Local anesthesia obtained using 4 cc 2% lidocaine  with epi. A 11 blade scalpel was used to lance the lesion following the skin lines. The lesion was then explored with Q-tip to break up loculations and to collect a culture    After cleaning, the wound was dressed using neosporin and gauze.   Wound care reviewed. Signs and symptoms of infection also reviewed with pt / significant other.  Assessment & Plan:  Breast abscess -  CULTURE, ROUTINE-ABSCESS -     Aerobic culture; Future   She should continue the prescribed antibiotics.  Wound care was reviewed as noted above.  Signs and symptoms of worsening infection should lead to immediate return or follow-up at the ED if after hours  Follow-up: Return if symptoms worsen or fail to improve.  Butler Der, M.D.

## 2023-09-02 NOTE — Telephone Encounter (Signed)
Noted  -LS

## 2023-09-02 NOTE — Telephone Encounter (Signed)
 FYI Only or Action Required?: FYI only for provider.  Patient was last seen in primary care on 08/31/2023 by Jolinda Norene HERO, DO.  Called Nurse Triage reporting Breast Problem.  Symptoms began a week ago.  Interventions attempted: Prescription medications: Doxycyline.  Symptoms are: gradually worsening.  Triage Disposition: See Physician Within 24 Hours  Patient/caregiver understands and will follow disposition?: Yes      Copied from CRM [id number]. Topic: Clinical - Red Word Triage >> Sep 02, 2023  2:02 PM Delon HERO wrote: Red Word that prompted transfer to Nurse Triage: patient is calling to report that she was Tuesday 08/31/2023 left breast abscess. Patient is reporting that drainage started last Thursday and Friday. And never stopped. Patient is reporting that the site were the drainage is coming from is a bigger than when evaluated on Tuesday. Reporting constant Breast pain. It hurts to put clothes or the sheets to touch it. The Doxycycline  Hyclate 100 mg Oral 2 times daily has not helped.    Reason for Disposition  [1] Breast looks infected (spreading redness, feels hot or painful to touch) AND [2] no fever  Answer Assessment - Initial Assessment Questions Patient was seen on 7/15 and is concerned that her symptoms are worsening and would like to be reevaluated.     1. SYMPTOM: What's the main symptom you're concerned about?  (e.g., lump, nipple discharge, pain, rash)     Draining abscess  2. LOCATION: Where is the abscess located?     Left side of the left breast  3. ONSET: When did symptoms start?     Approximately 1 week ago  Protocols used: Breast Symptoms-A-AH

## 2023-09-02 NOTE — Telephone Encounter (Signed)
 Yes it will be for now until the swelling and infection is gone.  The pore should close up after a week or so

## 2023-09-02 NOTE — Telephone Encounter (Signed)
 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  Referral sent to: [facility] [address] [place] [address] [phone]  After review from their MD's - their Office will call the Patient to schedule Appt :)

## 2023-09-02 NOTE — Telephone Encounter (Signed)
 Pt is being seen by [employer]

## 2023-09-03 DIAGNOSIS — N611 Abscess of the breast and nipple: Secondary | ICD-10-CM | POA: Diagnosis not present

## 2023-09-05 ENCOUNTER — Encounter: Payer: Self-pay | Admitting: Family Medicine

## 2023-09-07 LAB — AEROBIC CULTURE

## 2023-09-09 ENCOUNTER — Ambulatory Visit: Admitting: Plastic Surgery

## 2023-09-09 ENCOUNTER — Encounter: Payer: Self-pay | Admitting: Plastic Surgery

## 2023-09-09 VITALS — BP 143/69 | HR 97 | Ht 59.0 in | Wt 118.8 lb

## 2023-09-09 DIAGNOSIS — L989 Disorder of the skin and subcutaneous tissue, unspecified: Secondary | ICD-10-CM

## 2023-09-09 DIAGNOSIS — N6320 Unspecified lump in the left breast, unspecified quadrant: Secondary | ICD-10-CM

## 2023-09-09 NOTE — Progress Notes (Signed)
 Referring Provider Jolinda Norene HERO, DO [address] [place],  [place] [address]   CC:  Chief Complaint  Patient presents with   consult    .      Susan Baird is an 68 y.o. female.  HPI: Susan Baird is a 68 year old female who presents today for evaluation of a draining mass on her left breast.  Patient states that she has had this mass since approximately 2015.  Approximately a week ago she was seen by her primary care provider for increased redness and pain.  Her physician drained the abscess and referred her for definitive management.  No Known Allergies  Outpatient Encounter Medications as of 09/09/2023  Medication Sig   albuterol  (VENTOLIN  HFA) 108 (90 Base) MCG/ACT inhaler Inhale 2 puffs into the lungs every 6 (six) hours as needed for wheezing or shortness of breath.   Cholecalciferol (VITAMIN D3) 30 MCG/15ML LIQD Take by mouth.   Cyanocobalamin (VITAMIN B 12 PO) Take by mouth.   desloratadine  (CLARINEX ) 5 MG tablet Take 1 tablet (5 mg total) by mouth daily.   doxycycline  (VIBRA -TABS) 100 MG tablet Take 1 tablet (100 mg total) by mouth 2 (two) times daily for 10 days.   ELDERBERRY PO Take by mouth.   Magnesium 250 MG TABS Take by mouth.   Multiple Vitamin (MULTIVITAMIN ADULT PO) Take by mouth.   Potassium 95 MG TABS Take by mouth. Takes 3 times a week OTC   fluticasone  (FLONASE ) 50 MCG/ACT nasal spray Place 1 spray into both nostrils daily.   fluticasone  (FLONASE ) 50 MCG/ACT nasal spray Place 2 sprays into both nostrils daily. (Patient not taking: Reported on 09/09/2023)   Probiotic Product (PROBIOTIC PO) Take by mouth. (Patient not taking: Reported on 09/09/2023)   No facility-administered encounter medications on file as of 09/09/2023.     Past Medical History:  Diagnosis Date   Allergies    Allergy    Kidney stone     Past Surgical History:  Procedure Laterality Date   CESAREAN SECTION     CHOLECYSTECTOMY N/A 04/05/2019   Procedure: LAPAROSCOPIC CHOLECYSTECTOMY  WITH INTRAOPERATIVE CHOLANGIOGRAM;  Surgeon: Belinda Cough, MD;  Location: [facility];  Service: General;  Laterality: N/A;   TUBAL LIGATION      Family History  Problem Relation Age of Onset   Breast cancer Neg Hx     Social History   Social History Narrative   Not on file     Review of Systems General: Denies fevers, chills, weight loss CV: Denies chest pain, shortness of breath, palpitations Skin: A mass on the lateral aspect of the left breast with decreased pain.  Patient states that this has been there since 2015  Physical Exam    09/09/2023    2:53 PM 09/02/2023    3:10 PM 08/31/2023    2:48 PM  Vitals with BMI  Height 4' 11 4' 11 4' 11  Weight 118 lbs 13 oz 117 lbs 119 lbs  BMI 23.98 23.62 24.02  Systolic 143 117 887  Diastolic 69 55 54  Pulse 97 102 82    General:  No acute distress,  Alert and oriented, Non-Toxic, Normal speech and affect Skin: Patient has a mass approximately 1 cm with an additional 1 cm of erythema and induration around the mass.  The mass has an area of healing tissue over it.  Mammogram: Patient had a mammogram in April of this year which was indeterminate due to a mass in the  left breast.  She needs additional imaging. Assessment/Plan Mass left breast patient has pictures of the mass prior to the I&D.  It appears to be an epidermal inclusion cyst which has become infected.  The induration will need to resolve before I can plan on excising the mass.  Once I can determine how large and how deep this is I will schedule her for excision either in the office or in the operating room.  Additionally she needs to have the remainder of her imaging of the breast. Follow-up with me in 1 to 2 weeks.  Leonce KATHEE [place] 09/09/2023, 5:19 PM

## 2023-09-16 ENCOUNTER — Ambulatory Visit: Admitting: Plastic Surgery

## 2023-09-16 ENCOUNTER — Encounter: Payer: Self-pay | Admitting: Plastic Surgery

## 2023-09-16 VITALS — BP 126/67 | HR 89

## 2023-09-16 DIAGNOSIS — N6002 Solitary cyst of left breast: Secondary | ICD-10-CM | POA: Diagnosis not present

## 2023-09-16 DIAGNOSIS — L989 Disorder of the skin and subcutaneous tissue, unspecified: Secondary | ICD-10-CM

## 2023-09-16 NOTE — Progress Notes (Signed)
 Susan Baird returns today for reevaluation of the left breast cyst.  The cyst itself looks much better and there is significantly less erythema and no drainage.  Susan Baird does have a significant amount of excoriation around the cyst due to irritation from tape.  Susan Baird has replaced the tape and states that this is beginning to improve.  Mammograms and ultrasound were all consistent with a benign finding of a cyst.  Will take her to the operating room to remove the cyst.  Susan Baird understands that there is a higher than average chance of wound infection due to the chronically infected nature of sebaceous cyst.  Susan Baird will have a scar along the left portion of the breast.  It may be necessary to place a drain.  Postoperative antibiotics will be determined in the operating room.  All questions were answered to her satisfaction.  Will schedule her for excision of the cyst in the operating room.

## 2023-09-27 ENCOUNTER — Encounter: Payer: Self-pay | Admitting: Surgical

## 2023-09-27 ENCOUNTER — Ambulatory Visit: Admitting: Surgical

## 2023-09-27 VITALS — BP 115/70 | HR 99 | Ht 60.0 in | Wt 118.2 lb

## 2023-09-27 DIAGNOSIS — L989 Disorder of the skin and subcutaneous tissue, unspecified: Secondary | ICD-10-CM

## 2023-09-27 NOTE — Progress Notes (Addendum)
 Patient ID: Susan Baird, female    DOB: [birth year], 68 y.o.   MRN: [id number]  Chief Complaint  Patient presents with   Pre-op Exam      ICD-10-CM   1. Skin lesion  L98.9        History of Present Illness: Susan Baird is a 68 y.o.  female  with a history of left breast cyst.  She presents for preoperative evaluation for upcoming procedure, excision of left breast cyst, debridement of tissue, scheduled for 10/12/2023 with Dr. Waddell.  The patient has not had problems with anesthesia. No history of DVT/PE.  No family history of DVT/PE.  No family or personal history of bleeding or clotting disorders.  Patient is not currently taking any blood thinners.  No history of CVA/MI.  No history of Crohn's or ulcerative colitis.  No varicose veins.  No lower extremity swelling.  No history of cancer.  Summary of Previous Visit: Patient states that she has had this mass since approximately 2015. Approximately a week ago she was seen by her primary care provider for increased redness and pain. Her physician drained the abscess and referred her for definitive management.  Mammograms and ultrasound were all consistent with a benign finding of a cyst.   PMH Significant for: Tobacco use/smoker-half pack per day.  History of left breast cyst.  Patient reports has been feeling well lately, no recent changes to her health.  She denies any cardiac or pulmonary disease.  She does not have any shortness of breath or chest pain today.   Patient does report that she has tried Mepilex border dressings and had a significant skin rash/reaction to them.  Past Medical History: Allergies: No Known Allergies  Current Medications:  Current Outpatient Medications:    Cholecalciferol (VITAMIN D3) 30 MCG/15ML LIQD, Take by mouth., Disp: , Rfl:    Cyanocobalamin (VITAMIN B 12 PO), Take by mouth., Disp: , Rfl:    desloratadine  (CLARINEX ) 5 MG tablet, Take 1 tablet (5 mg total) by mouth daily., Disp: 90  tablet, Rfl: 4   ELDERBERRY PO, Take by mouth., Disp: , Rfl:    fluticasone  (FLONASE ) 50 MCG/ACT nasal spray, Place 1 spray into both nostrils daily., Disp: 1 g, Rfl: 2   Magnesium 250 MG TABS, Take by mouth., Disp: , Rfl:    Multiple Vitamin (MULTIVITAMIN ADULT PO), Take by mouth., Disp: , Rfl:    Potassium 95 MG TABS, Take by mouth. Takes 3 times a week OTC, Disp: , Rfl:    albuterol  (VENTOLIN  HFA) 108 (90 Base) MCG/ACT inhaler, Inhale 2 puffs into the lungs every 6 (six) hours as needed for wheezing or shortness of breath., Disp: 8 g, Rfl: 0   fluticasone  (FLONASE ) 50 MCG/ACT nasal spray, Place 2 sprays into both nostrils daily. (Patient not taking: Reported on 09/27/2023), Disp: 16 g, Rfl: 6   Probiotic Product (PROBIOTIC PO), Take by mouth. (Patient not taking: Reported on 09/27/2023), Disp: , Rfl:   Past Medical Problems: Past Medical History:  Diagnosis Date   Allergies    Allergy    Kidney stone     Past Surgical History: Past Surgical History:  Procedure Laterality Date   CESAREAN SECTION     CHOLECYSTECTOMY N/A 04/05/2019   Procedure: LAPAROSCOPIC CHOLECYSTECTOMY WITH INTRAOPERATIVE CHOLANGIOGRAM;  Surgeon: Belinda Cough, MD;  Location: [facility];  Service: General;  Laterality: N/A;   TUBAL LIGATION      Social History: Social History   Socioeconomic  History   Marital status: Married    Spouse name: Not on file   Number of children: Not on file   Years of education: Not on file   Highest education level: 12th grade  Occupational History   Not on file  Tobacco Use   Smoking status: Every Day    Current packs/day: 0.25    Average packs/day: 1 pack/day for 40.6 years (40.2 ttl pk-yrs)    Types: Cigarettes    Start date: 58   Smokeless tobacco: Never  Vaping Use   Vaping status: Never Used  Substance and Sexual Activity   Alcohol use: No   Drug use: No   Sexual activity: Not Currently  Other Topics Concern   Not on file  Social History  Narrative   Not on file   Social Drivers of Health   Financial Resource Strain: Patient Declined (08/29/2023)   Overall Financial Resource Strain (CARDIA)    Difficulty of Paying Living Expenses: Patient declined  Recent Concern: Financial Resource Strain - High Risk (06/16/2023)   Overall Financial Resource Strain (CARDIA)    Difficulty of Paying Living Expenses: Very hard  Food Insecurity: Unknown (08/29/2023)   Hunger Vital Sign    Worried About Running Out of Food in the Last Year: Patient declined    Ran Out of Food in the Last Year: Not on file  Transportation Needs: No Transportation Needs (06/16/2023)   PRAPARE - Administrator, Civil Service (Medical): No    Lack of Transportation (Non-Medical): No  Physical Activity: Inactive (08/29/2023)   Exercise Vital Sign    Days of Exercise per Week: 0 days    Minutes of Exercise per Session: Not on file  Stress: No Stress Concern Present (08/29/2023)   [employer] of Occupational Health - Occupational Stress Questionnaire    Feeling of Stress: Not at all  Social Connections: Unknown (08/29/2023)   Social Connection and Isolation Panel    Frequency of Communication with Friends and Family: Patient declined    Frequency of Social Gatherings with Friends and Family: Patient declined    Attends Religious Services: Patient declined    Database administrator or Organizations: Patient declined    Attends Banker Meetings: Not on file    Marital Status: Patient declined  Intimate Partner Violence: Not At Risk (02/18/2023)   Humiliation, Afraid, Rape, and Kick questionnaire    Fear of Current or Ex-Partner: No    Emotionally Abused: No    Physically Abused: No    Sexually Abused: No    Family History: Family History  Problem Relation Age of Onset   Breast cancer Neg Hx     Review of Systems: Review of Systems  Constitutional:  Negative for chills and fever.  Respiratory:  Negative for cough, shortness of  breath and wheezing.   Cardiovascular:  Negative for chest pain and leg swelling.  Gastrointestinal: Negative.   Neurological: Negative.     Physical Exam: Vital Signs BP 115/70 (BP Location: Left Arm, Patient Position: Sitting, Cuff Size: Normal)   Pulse 99   Ht 5' (1.524 m)   Wt 118 lb 3.2 oz (53.6 kg)   SpO2 94%   BMI 23.08 kg/m   Physical Exam Constitutional:      General: Not in acute distress.    Appearance: Normal appearance. Not ill-appearing.  HENT:     Head: Normocephalic and atraumatic.  Eyes:     Pupils: Pupils are equal, round Neck:  Musculoskeletal: Normal range of motion.  Cardiovascular:     Rate and Rhythm: Normal rate    Pulses: Normal pulses.  Pulmonary:     Effort: Pulmonary effort is normal. No respiratory distress.  Abdominal:     General: Abdomen is flat. There is no distension.  Musculoskeletal: Normal range of motion.  Skin:    General: Skin is warm and dry.     Findings: No erythema or rash.  Neurological:     General: No focal deficit present.     Mental Status: Alert and oriented to person, place, and time. Mental status is at baseline.     Motor: No weakness.  Psychiatric:        Mood and Affect: Mood normal.        Behavior: Behavior normal.    Assessment/Plan: The patient is scheduled for excision of left breast cyst, debridement of tissue with Dr. Waddell.  Risks, benefits, and alternatives of procedure discussed, questions answered and consent obtained.    Smoking Status: Current half pack per day smoker; Counseling Given?  Discussed increased risk of postoperative complications including infection, delayed wound healing, incisional separation due to nicotine use Last Mammogram: Patient had mammogram April 2025, needed additional imaging.  Subsequently had ultrasound of the left breast consistent with sebaceous cyst.  BI-RADS 2: Benign.  Caprini Score: 5; Risk Factors include: Age, nicotine use/smoking, and length of planned  surgery. Recommendation for mechanical prophylaxis. Encourage early ambulation.   Pictures obtained: @consult   Post-op Rx sent to pharmacy: Tramadol, Zofran   Patient was provided with the General Surgical Risk consent document and Pain Medication Agreement prior to their appointment.  They had adequate time to read through the risk consent documents and Pain Medication Agreement. We also discussed them in person together during this preop appointment. All of their questions were answered to their satisfaction.  Recommended calling if they have any further questions.  Risk consent form and Pain Medication Agreement to be scanned into patient's chart.  Discussed increased risk of infection due to nicotine use, also due to previous infected cyst in this area.  Discussed with patient the expected incision lines along the lateral breast extending at a minimum about 5 cm.  Discussed that the incision will always be there and will fade with time but will always be present.  All of her questions were answered to her content in regards to this.  She was able to read through the consent form and did not have any questions.  Discussed stopping multivitamin, vitamin D, elderberry, and any other supplements 2 weeks prior to surgery.   Electronically signed by: Donnice PARAS Edrick Whitehorn, PA-C 09/27/2023 2:29 PM

## 2023-09-27 NOTE — H&P (View-Only) (Signed)
 Patient ID: Susan Baird, female    DOB: [birth year], 68 y.o.   MRN: [id number]  Chief Complaint  Patient presents with   Pre-op Exam      ICD-10-CM   1. Skin lesion  L98.9        History of Present Illness: Susan Baird is a 68 y.o.  female  with a history of left breast cyst.  She presents for preoperative evaluation for upcoming procedure, excision of left breast cyst, debridement of tissue, scheduled for 10/12/2023 with Dr. Waddell.  The patient has not had problems with anesthesia. No history of DVT/PE.  No family history of DVT/PE.  No family or personal history of bleeding or clotting disorders.  Patient is not currently taking any blood thinners.  No history of CVA/MI.  No history of Crohn's or ulcerative colitis.  No varicose veins.  No lower extremity swelling.  No history of cancer.  Summary of Previous Visit: Patient states that she has had this mass since approximately 2015. Approximately a week ago she was seen by her primary care provider for increased redness and pain. Her physician drained the abscess and referred her for definitive management.  Mammograms and ultrasound were all consistent with a benign finding of a cyst.   PMH Significant for: Tobacco use/smoker-half pack per day.  History of left breast cyst.  Patient reports has been feeling well lately, no recent changes to her health.  She denies any cardiac or pulmonary disease.  She does not have any shortness of breath or chest pain today.   Patient does report that she has tried Mepilex border dressings and had a significant skin rash/reaction to them.  Past Medical History: Allergies: No Known Allergies  Current Medications:  Current Outpatient Medications:    Cholecalciferol (VITAMIN D3) 30 MCG/15ML LIQD, Take by mouth., Disp: , Rfl:    Cyanocobalamin (VITAMIN B 12 PO), Take by mouth., Disp: , Rfl:    desloratadine  (CLARINEX ) 5 MG tablet, Take 1 tablet (5 mg total) by mouth daily., Disp: 90  tablet, Rfl: 4   ELDERBERRY PO, Take by mouth., Disp: , Rfl:    fluticasone  (FLONASE ) 50 MCG/ACT nasal spray, Place 1 spray into both nostrils daily., Disp: 1 g, Rfl: 2   Magnesium 250 MG TABS, Take by mouth., Disp: , Rfl:    Multiple Vitamin (MULTIVITAMIN ADULT PO), Take by mouth., Disp: , Rfl:    Potassium 95 MG TABS, Take by mouth. Takes 3 times a week OTC, Disp: , Rfl:    albuterol  (VENTOLIN  HFA) 108 (90 Base) MCG/ACT inhaler, Inhale 2 puffs into the lungs every 6 (six) hours as needed for wheezing or shortness of breath., Disp: 8 g, Rfl: 0   fluticasone  (FLONASE ) 50 MCG/ACT nasal spray, Place 2 sprays into both nostrils daily. (Patient not taking: Reported on 09/27/2023), Disp: 16 g, Rfl: 6   Probiotic Product (PROBIOTIC PO), Take by mouth. (Patient not taking: Reported on 09/27/2023), Disp: , Rfl:   Past Medical Problems: Past Medical History:  Diagnosis Date   Allergies    Allergy    Kidney stone     Past Surgical History: Past Surgical History:  Procedure Laterality Date   CESAREAN SECTION     CHOLECYSTECTOMY N/A 04/05/2019   Procedure: LAPAROSCOPIC CHOLECYSTECTOMY WITH INTRAOPERATIVE CHOLANGIOGRAM;  Surgeon: Belinda Cough, MD;  Location: [facility];  Service: General;  Laterality: N/A;   TUBAL LIGATION      Social History: Social History   Socioeconomic  History   Marital status: Married    Spouse name: Not on file   Number of children: Not on file   Years of education: Not on file   Highest education level: 12th grade  Occupational History   Not on file  Tobacco Use   Smoking status: Every Day    Current packs/day: 0.25    Average packs/day: 1 pack/day for 40.6 years (40.2 ttl pk-yrs)    Types: Cigarettes    Start date: 58   Smokeless tobacco: Never  Vaping Use   Vaping status: Never Used  Substance and Sexual Activity   Alcohol use: No   Drug use: No   Sexual activity: Not Currently  Other Topics Concern   Not on file  Social History  Narrative   Not on file   Social Drivers of Health   Financial Resource Strain: Patient Declined (08/29/2023)   Overall Financial Resource Strain (CARDIA)    Difficulty of Paying Living Expenses: Patient declined  Recent Concern: Financial Resource Strain - High Risk (06/16/2023)   Overall Financial Resource Strain (CARDIA)    Difficulty of Paying Living Expenses: Very hard  Food Insecurity: Unknown (08/29/2023)   Hunger Vital Sign    Worried About Running Out of Food in the Last Year: Patient declined    Ran Out of Food in the Last Year: Not on file  Transportation Needs: No Transportation Needs (06/16/2023)   PRAPARE - Administrator, Civil Service (Medical): No    Lack of Transportation (Non-Medical): No  Physical Activity: Inactive (08/29/2023)   Exercise Vital Sign    Days of Exercise per Week: 0 days    Minutes of Exercise per Session: Not on file  Stress: No Stress Concern Present (08/29/2023)   [employer] of Occupational Health - Occupational Stress Questionnaire    Feeling of Stress: Not at all  Social Connections: Unknown (08/29/2023)   Social Connection and Isolation Panel    Frequency of Communication with Friends and Family: Patient declined    Frequency of Social Gatherings with Friends and Family: Patient declined    Attends Religious Services: Patient declined    Database administrator or Organizations: Patient declined    Attends Banker Meetings: Not on file    Marital Status: Patient declined  Intimate Partner Violence: Not At Risk (02/18/2023)   Humiliation, Afraid, Rape, and Kick questionnaire    Fear of Current or Ex-Partner: No    Emotionally Abused: No    Physically Abused: No    Sexually Abused: No    Family History: Family History  Problem Relation Age of Onset   Breast cancer Neg Hx     Review of Systems: Review of Systems  Constitutional:  Negative for chills and fever.  Respiratory:  Negative for cough, shortness of  breath and wheezing.   Cardiovascular:  Negative for chest pain and leg swelling.  Gastrointestinal: Negative.   Neurological: Negative.     Physical Exam: Vital Signs BP 115/70 (BP Location: Left Arm, Patient Position: Sitting, Cuff Size: Normal)   Pulse 99   Ht 5' (1.524 m)   Wt 118 lb 3.2 oz (53.6 kg)   SpO2 94%   BMI 23.08 kg/m   Physical Exam Constitutional:      General: Not in acute distress.    Appearance: Normal appearance. Not ill-appearing.  HENT:     Head: Normocephalic and atraumatic.  Eyes:     Pupils: Pupils are equal, round Neck:  Musculoskeletal: Normal range of motion.  Cardiovascular:     Rate and Rhythm: Normal rate    Pulses: Normal pulses.  Pulmonary:     Effort: Pulmonary effort is normal. No respiratory distress.  Abdominal:     General: Abdomen is flat. There is no distension.  Musculoskeletal: Normal range of motion.  Skin:    General: Skin is warm and dry.     Findings: No erythema or rash.  Neurological:     General: No focal deficit present.     Mental Status: Alert and oriented to person, place, and time. Mental status is at baseline.     Motor: No weakness.  Psychiatric:        Mood and Affect: Mood normal.        Behavior: Behavior normal.    Assessment/Plan: The patient is scheduled for excision of left breast cyst, debridement of tissue with Dr. Waddell.  Risks, benefits, and alternatives of procedure discussed, questions answered and consent obtained.    Smoking Status: Current half pack per day smoker; Counseling Given?  Discussed increased risk of postoperative complications including infection, delayed wound healing, incisional separation due to nicotine use Last Mammogram: Patient had mammogram April 2025, needed additional imaging.  Subsequently had ultrasound of the left breast consistent with sebaceous cyst.  BI-RADS 2: Benign.  Caprini Score: 5; Risk Factors include: Age, nicotine use/smoking, and length of planned  surgery. Recommendation for mechanical prophylaxis. Encourage early ambulation.   Pictures obtained: @consult   Post-op Rx sent to pharmacy: Tramadol, Zofran   Patient was provided with the General Surgical Risk consent document and Pain Medication Agreement prior to their appointment.  They had adequate time to read through the risk consent documents and Pain Medication Agreement. We also discussed them in person together during this preop appointment. All of their questions were answered to their satisfaction.  Recommended calling if they have any further questions.  Risk consent form and Pain Medication Agreement to be scanned into patient's chart.  Discussed increased risk of infection due to nicotine use, also due to previous infected cyst in this area.  Discussed with patient the expected incision lines along the lateral breast extending at a minimum about 5 cm.  Discussed that the incision will always be there and will fade with time but will always be present.  All of her questions were answered to her content in regards to this.  She was able to read through the consent form and did not have any questions.  Discussed stopping multivitamin, vitamin D, elderberry, and any other supplements 2 weeks prior to surgery.   Electronically signed by: Donnice PARAS Edrick Whitehorn, PA-C 09/27/2023 2:29 PM

## 2023-10-08 ENCOUNTER — Other Ambulatory Visit: Payer: Self-pay

## 2023-10-08 ENCOUNTER — Encounter (HOSPITAL_COMMUNITY): Payer: Self-pay | Admitting: Plastic Surgery

## 2023-10-08 NOTE — Progress Notes (Signed)
 PCP - Norene Fielding, DO  Anesthesia review: N  Patient verbally denies any shortness of breath, fever, cough and chest pain during phone call   -------------  SDW INSTRUCTIONS given:  Your procedure is scheduled on Tuesday, Aug. 26th.  Report to [facility] Main Entrance A at 0945 A.M., and check in at the Admitting office.  Call this number if you have problems the morning of surgery:  [phone]   Remember:  Do not eat after midnight the night before your surgery  You may drink clear liquids until 0900 the morning of your surgery.   Clear liquids allowed are: Water, Non-Citrus Juices (without pulp), Carbonated Beverages, Clear Tea, Black Coffee Only, and Gatorade    Take these medicines the morning of surgery with A SIP OF WATER  cetirizine (ZYRTEC)  fluticasone  (FLONASE )-if needed  As of today, STOP taking any Aspirin (unless otherwise instructed by your surgeon) Aleve, Naproxen, Ibuprofen , Motrin , Advil , Goody's, BC's, all herbal medications, fish oil, and all vitamins.                      Do not wear jewelry, make up, or nail polish            Do not wear lotions, powders, perfumes/colognes, or deodorant.            Do not shave 48 hours prior to surgery.  Men may shave face and neck.            Do not bring valuables to the hospital.            [facility] is not responsible for any belongings or valuables.  Do NOT Smoke (Tobacco/Vaping) 24 hours prior to your procedure If you use a CPAP at night, you may bring all equipment for your overnight stay.   Contacts, glasses, dentures or bridgework may not be worn into surgery.      For patients admitted to the hospital, discharge time will be determined by your treatment team.   Patients discharged the day of surgery will not be allowed to drive home, and someone needs to stay with them for 24 hours.    Special instructions:   [facility]- Preparing For Surgery  Before surgery, you can play an important role.  Because skin is not sterile, your skin needs to be as free of germs as possible. You can reduce the number of germs on your skin by washing with CHG (chlorahexidine gluconate) Soap before surgery.  CHG is an antiseptic cleaner which kills germs and bonds with the skin to continue killing germs even after washing.    Oral Hygiene is also important to reduce your risk of infection.  Remember - BRUSH YOUR TEETH THE MORNING OF SURGERY WITH YOUR REGULAR TOOTHPASTE  Please do not use if you have an allergy to CHG or antibacterial soaps. If your skin becomes reddened/irritated stop using the CHG.  Do not shave (including legs and underarms) for at least 48 hours prior to first CHG shower. It is OK to shave your face.  Please follow these instructions carefully.   Shower the NIGHT BEFORE SURGERY and the MORNING OF SURGERY with DIAL Soap.   Pat yourself dry with a CLEAN TOWEL.  Wear CLEAN PAJAMAS to bed the night before surgery  Place CLEAN SHEETS on your bed the night of your first shower and DO NOT SLEEP WITH PETS.   Day of Surgery: Please shower morning of surgery  Wear Clean/Comfortable clothing the morning  of surgery Do not apply any deodorants/lotions.   Remember to brush your teeth WITH YOUR REGULAR TOOTHPASTE.   Questions were answered. Patient verbalized understanding of instructions.

## 2023-10-12 ENCOUNTER — Encounter (HOSPITAL_COMMUNITY)
Admission: RE | Disposition: A | Discharge status: Home / Self Care | Payer: Self-pay | Source: Home / Self Care | Attending: Plastic Surgery

## 2023-10-12 ENCOUNTER — Ambulatory Visit (HOSPITAL_COMMUNITY): Admitting: Anesthesiology

## 2023-10-12 ENCOUNTER — Other Ambulatory Visit: Payer: Self-pay

## 2023-10-12 ENCOUNTER — Ambulatory Visit (HOSPITAL_COMMUNITY)
Admission: RE | Admit: 2023-10-12 | Discharge: 2023-10-12 | Disposition: A | Discharge status: Home / Self Care | Attending: Plastic Surgery | Admitting: Plastic Surgery

## 2023-10-12 ENCOUNTER — Encounter (HOSPITAL_COMMUNITY): Payer: Self-pay | Admitting: Plastic Surgery

## 2023-10-12 DIAGNOSIS — F17210 Nicotine dependence, cigarettes, uncomplicated: Secondary | ICD-10-CM | POA: Insufficient documentation

## 2023-10-12 DIAGNOSIS — N6002 Solitary cyst of left breast: Secondary | ICD-10-CM | POA: Diagnosis not present

## 2023-10-12 DIAGNOSIS — N6459 Other signs and symptoms in breast: Secondary | ICD-10-CM

## 2023-10-12 DIAGNOSIS — L720 Epidermal cyst: Secondary | ICD-10-CM

## 2023-10-12 DIAGNOSIS — R0602 Shortness of breath: Secondary | ICD-10-CM | POA: Diagnosis not present

## 2023-10-12 DIAGNOSIS — N61 Mastitis without abscess: Secondary | ICD-10-CM | POA: Diagnosis not present

## 2023-10-12 DIAGNOSIS — Z79899 Other long term (current) drug therapy: Secondary | ICD-10-CM | POA: Diagnosis not present

## 2023-10-12 DIAGNOSIS — L989 Disorder of the skin and subcutaneous tissue, unspecified: Secondary | ICD-10-CM | POA: Diagnosis present

## 2023-10-12 DIAGNOSIS — N6320 Unspecified lump in the left breast, unspecified quadrant: Secondary | ICD-10-CM | POA: Diagnosis not present

## 2023-10-12 HISTORY — PX: BREAST CYST EXCISION: SHX579

## 2023-10-12 HISTORY — PX: INCISION AND DRAINAGE OF WOUND: SHX1803

## 2023-10-12 LAB — CBC
HCT: 46.3 % — ABNORMAL HIGH (ref 36.0–46.0)
Hemoglobin: 14.7 g/dL (ref 12.0–15.0)
MCH: 29.5 pg (ref 26.0–34.0)
MCHC: 31.7 g/dL (ref 30.0–36.0)
MCV: 92.8 fL (ref 80.0–100.0)
Platelets: 319 K/uL (ref 150–400)
RBC: 4.99 MIL/uL (ref 3.87–5.11)
RDW: 13.1 % (ref 11.5–15.5)
WBC: 10.4 K/uL (ref 4.0–10.5)
nRBC: 0 % (ref 0.0–0.2)

## 2023-10-12 SURGERY — EXCISION, CYST, BREAST
Anesthesia: General | Site: Breast | Laterality: Left

## 2023-10-12 MED ORDER — PROPOFOL 10 MG/ML IV BOLUS
INTRAVENOUS | Status: AC
  Filled 2023-10-12: qty 20

## 2023-10-12 MED ORDER — ACETAMINOPHEN 500 MG PO TABS
ORAL_TABLET | ORAL | Status: AC
  Administered 2023-10-12: 1000 mg via ORAL
  Filled 2023-10-12: qty 2

## 2023-10-12 MED ORDER — OXYCODONE HCL 5 MG PO TABS: 5.0000 mg | ORAL_TABLET | Freq: Once | ORAL | Status: DC | PRN

## 2023-10-12 MED ORDER — AMISULPRIDE (ANTIEMETIC) 5 MG/2ML IV SOLN: 10.0000 mg | Freq: Once | INTRAVENOUS | Status: DC | PRN

## 2023-10-12 MED ORDER — CHLORHEXIDINE GLUCONATE 0.12 % MT SOLN: 15.0000 mL | Freq: Once | OROMUCOSAL | Status: AC

## 2023-10-12 MED ORDER — ACETAMINOPHEN 500 MG PO TABS: 1000.0000 mg | ORAL_TABLET | Freq: Once | ORAL | Status: AC

## 2023-10-12 MED ORDER — CEFAZOLIN SODIUM-DEXTROSE 2-4 GM/100ML-% IV SOLN
INTRAVENOUS | Status: AC
  Filled 2023-10-12: qty 100

## 2023-10-12 MED ORDER — FENTANYL CITRATE (PF) 250 MCG/5ML IJ SOLN
INTRAMUSCULAR | Status: DC | PRN
  Administered 2023-10-12: 50 ug via INTRAVENOUS
  Administered 2023-10-12: 25 ug via INTRAVENOUS

## 2023-10-12 MED ORDER — BUPIVACAINE-EPINEPHRINE (PF) 0.25% -1:200000 IJ SOLN
INTRAMUSCULAR | Status: DC | PRN
  Administered 2023-10-12: 20 mL

## 2023-10-12 MED ORDER — LIDOCAINE 2% (20 MG/ML) 5 ML SYRINGE
INTRAMUSCULAR | Status: DC | PRN
  Administered 2023-10-12: 50 mg via INTRAVENOUS

## 2023-10-12 MED ORDER — LIDOCAINE 2% (20 MG/ML) 5 ML SYRINGE
INTRAMUSCULAR | Status: AC
Start: 2023-10-12 — End: 2023-10-12
  Filled 2023-10-12: qty 10

## 2023-10-12 MED ORDER — ONDANSETRON HCL 4 MG/2ML IJ SOLN
INTRAMUSCULAR | Status: DC | PRN
  Administered 2023-10-12: 4 mg via INTRAVENOUS

## 2023-10-12 MED ORDER — LACTATED RINGERS IV SOLN: INTRAVENOUS | Status: DC

## 2023-10-12 MED ORDER — DEXAMETHASONE SODIUM PHOSPHATE 10 MG/ML IJ SOLN
INTRAMUSCULAR | Status: DC | PRN
  Administered 2023-10-12: 5 mg via INTRAVENOUS

## 2023-10-12 MED ORDER — ONDANSETRON HCL 4 MG/2ML IJ SOLN
INTRAMUSCULAR | Status: AC
Start: 2023-10-12 — End: 2023-10-12
  Filled 2023-10-12: qty 4

## 2023-10-12 MED ORDER — CEFAZOLIN SODIUM-DEXTROSE 2-4 GM/100ML-% IV SOLN
2.0000 g | INTRAVENOUS | Status: AC
  Administered 2023-10-12: 2 g via INTRAVENOUS

## 2023-10-12 MED ORDER — 0.9 % SODIUM CHLORIDE (POUR BTL) OPTIME
TOPICAL | Status: DC | PRN
  Administered 2023-10-12: 1000 mL

## 2023-10-12 MED ORDER — EPHEDRINE SULFATE-NACL 50-0.9 MG/10ML-% IV SOSY
PREFILLED_SYRINGE | INTRAVENOUS | Status: DC | PRN
  Administered 2023-10-12: 10 mg via INTRAVENOUS

## 2023-10-12 MED ORDER — CHLORHEXIDINE GLUCONATE CLOTH 2 % EX PADS: 6.0000 | MEDICATED_PAD | Freq: Once | CUTANEOUS | Status: DC

## 2023-10-12 MED ORDER — CHLORHEXIDINE GLUCONATE 0.12 % MT SOLN
OROMUCOSAL | Status: AC
  Administered 2023-10-12: 15 mL via OROMUCOSAL
  Filled 2023-10-12: qty 15

## 2023-10-12 MED ORDER — MIDAZOLAM HCL 2 MG/2ML IJ SOLN
INTRAMUSCULAR | Status: DC | PRN
  Administered 2023-10-12: 2 mg via INTRAVENOUS

## 2023-10-12 MED ORDER — MIDAZOLAM HCL 2 MG/2ML IJ SOLN
INTRAMUSCULAR | Status: AC
  Filled 2023-10-12: qty 2

## 2023-10-12 MED ORDER — HYDROMORPHONE HCL 1 MG/ML IJ SOLN: 0.2500 mg | INTRAMUSCULAR | Status: DC | PRN

## 2023-10-12 MED ORDER — ONDANSETRON HCL 4 MG/2ML IJ SOLN: 4.0000 mg | Freq: Once | INTRAMUSCULAR | Status: DC | PRN

## 2023-10-12 MED ORDER — FENTANYL CITRATE (PF) 250 MCG/5ML IJ SOLN
INTRAMUSCULAR | Status: AC
  Filled 2023-10-12: qty 5

## 2023-10-12 MED ORDER — ORAL CARE MOUTH RINSE: 15.0000 mL | Freq: Once | OROMUCOSAL | Status: AC

## 2023-10-12 MED ORDER — PROPOFOL 10 MG/ML IV BOLUS
INTRAVENOUS | Status: DC | PRN
  Administered 2023-10-12: 50 mg via INTRAVENOUS
  Administered 2023-10-12: 100 mg via INTRAVENOUS

## 2023-10-12 MED ORDER — DEXAMETHASONE SODIUM PHOSPHATE 10 MG/ML IJ SOLN
INTRAMUSCULAR | Status: AC
  Filled 2023-10-12: qty 1

## 2023-10-12 MED ORDER — OXYCODONE HCL 5 MG/5ML PO SOLN: 5.0000 mg | Freq: Once | ORAL | Status: DC | PRN

## 2023-10-12 MED ORDER — BUPIVACAINE-EPINEPHRINE (PF) 0.25% -1:200000 IJ SOLN
INTRAMUSCULAR | Status: AC
Start: 2023-10-12 — End: 2023-10-12
  Filled 2023-10-12: qty 30

## 2023-10-12 SURGICAL SUPPLY — 23 items
CANISTER SUCTION 3000ML PPV (SUCTIONS) ×1 IMPLANT
CNTNR URN SCR LID CUP LEK RST (MISCELLANEOUS) IMPLANT
COVER SURGICAL LIGHT HANDLE (MISCELLANEOUS) ×1 IMPLANT
DERMABOND ADVANCED .7 DNX12 (GAUZE/BANDAGES/DRESSINGS) ×1 IMPLANT
DRAPE LAPAROTOMY 100X72 PEDS (DRAPES) ×1 IMPLANT
ELECTRODE REM PT RTRN 9FT ADLT (ELECTROSURGICAL) ×1 IMPLANT
GAUZE SPONGE 4X4 12PLY STRL (GAUZE/BANDAGES/DRESSINGS) ×1 IMPLANT
GLOVE BIO SURGEON STRL SZ8 (GLOVE) ×1 IMPLANT
GLOVE BIOGEL PI IND STRL 8 (GLOVE) ×1 IMPLANT
GOWN STRL REUS W/ TWL LRG LVL3 (GOWN DISPOSABLE) ×3 IMPLANT
GOWN STRL REUS W/ TWL XL LVL3 (GOWN DISPOSABLE) ×1 IMPLANT
HIBICLENS CHG 4% 4OZ (MISCELLANEOUS) ×1 IMPLANT
KIT BASIN OR (CUSTOM PROCEDURE TRAY) ×1 IMPLANT
KIT TURNOVER KIT B (KITS) ×1 IMPLANT
NDL HYPO 25GX1X1/2 BEV (NEEDLE) ×1 IMPLANT
NEEDLE HYPO 25GX1X1/2 BEV (NEEDLE) ×1 IMPLANT
NS IRRIG 1000ML POUR BTL (IV SOLUTION) ×2 IMPLANT
PACK GENERAL/GYN (CUSTOM PROCEDURE TRAY) ×1 IMPLANT
PAD ARMBOARD POSITIONER FOAM (MISCELLANEOUS) ×2 IMPLANT
SUT MNCRL AB 4-0 PS2 18 (SUTURE) ×2 IMPLANT
SUT MON AB 3-0 SH27 (SUTURE) ×2 IMPLANT
SYR CONTROL 10ML LL (SYRINGE) ×1 IMPLANT
TOWEL GREEN STERILE (TOWEL DISPOSABLE) ×1 IMPLANT

## 2023-10-12 NOTE — Discharge Instructions (Addendum)
 1 May remove dressing at home.  May cover with a dressing of choice if desired 2 May shower in 24 hours 3 May drive in 24 hours if not taking narcotic pain medicine 4 Please do not submerge the incision in water.  No heavy lifting greater than 20 pounds and no vigorous activity. 5 Please use Tylenol  or Motrin  as first choice for pain control 6 Call the office ( [phone]) for any questions or concerns

## 2023-10-12 NOTE — Anesthesia Procedure Notes (Signed)
 Procedure Name: LMA Insertion Date/Time: 10/12/2023 12:16 PM  Performed by: Lettie Derrek Dacosta, CRNAPre-anesthesia Checklist: Patient identified, Emergency Drugs available, Suction available, Timeout performed and Patient being monitored Patient Re-evaluated:Patient Re-evaluated prior to induction Oxygen Delivery Method: Circle system utilized Preoxygenation: Pre-oxygenation with 100% oxygen Induction Type: IV induction Ventilation: Mask ventilation without difficulty LMA: LMA inserted LMA Size: 4.0 Number of attempts: 1 Placement Confirmation: positive ETCO2 and breath sounds checked- equal and bilateral Tube secured with: Tape Dental Injury: Teeth and Oropharynx as per pre-operative assessment

## 2023-10-12 NOTE — Op Note (Signed)
 DATE OF OPERATION: 10/12/2023  LOCATION: [facility] Main operating Room  PREOPERATIVE DIAGNOSIS: Left breast skin lesion  POSTOPERATIVE DIAGNOSIS: Same  PROCEDURE: Excision of left breast skin lesion  SURGEON: Marinell Birmingham, MD  ASSISTANT: Not applicable  EBL: 5 cc  CONDITION: Stable  COMPLICATIONS: None  INDICATION: The patient, Susan Baird, is a 68 y.o. female born on 25-Nov-1955, is here for treatment of a skin lesion on the left lateral breast consistent with an epidermal inclusion cyst.   PROCEDURE DETAILS:  The patient was seen prior to surgery and marked.  The IV antibiotics were given. The patient was taken to the operating room and given a general anesthetic. A standard time out was performed and all information was confirmed by those in the room. SCDs were placed.   The left breast was prepped and draped in usual sterile manner.  An elliptical incision was made around the skin lesion and the site of the previous drainage.  Full-thickness skin was excised down to the subcutaneous fat.  A small amount of subcutaneous fat was also removed to ensure that all of the diseased tissue was removed.  The wound was irrigated with saline.  It was infiltrated with quarter percent Marcaine  with epinephrine .  The dermis was closed with interrupted 3-0 Monocryl sutures and the skin was closed with a running 4-0 Monocryl subcuticular stitch.  The entire length of the incision was approximately 5 cm.  The incision was sealed with Dermabond and a sterile dressing was placed over the incision.  The patient was awakened from anesthesia without incident transferred to the recovery room in good condition.  All instrument needle and sponge counts were reported as correct.  There were no complications appreciated. The patient was allowed to wake up and taken to recovery room in stable condition at the end of the case. The family was notified at the end of the case.

## 2023-10-12 NOTE — Anesthesia Preprocedure Evaluation (Addendum)
 Anesthesia Evaluation  Patient identified by MRN, date of birth, ID band Patient awake    Reviewed: Allergy & Precautions, NPO status , Patient's Chart, lab work & pertinent test results  Airway Mallampati: II  TM Distance: >3 FB Neck ROM: Full    Dental  (+) Teeth Intact, Dental Advisory Given   Pulmonary shortness of breath and with exertion, Current Smoker 40 pack year history  10 cigg/d, smoked this AM    Pulmonary exam normal breath sounds clear to auscultation       Cardiovascular negative cardio ROS Normal cardiovascular exam Rhythm:Regular Rate:Normal     Neuro/Psych negative neurological ROS  negative psych ROS   GI/Hepatic negative GI ROS, Neg liver ROS,,,  Endo/Other  negative endocrine ROS    Renal/GU negative Renal ROS  negative genitourinary   Musculoskeletal negative musculoskeletal ROS (+)    Abdominal   Peds  Hematology negative hematology ROS (+)   Anesthesia Other Findings   Reproductive/Obstetrics negative OB ROS                              Anesthesia Physical Anesthesia Plan  ASA: 2  Anesthesia Plan: General   Post-op Pain Management: Tylenol  PO (pre-op)*   Induction: Intravenous  PONV Risk Score and Plan: 2 and Ondansetron , Dexamethasone , Midazolam  and Treatment may vary due to age or medical condition  Airway Management Planned: LMA  Additional Equipment: None  Intra-op Plan:   Post-operative Plan: Extubation in OR  Informed Consent: I have reviewed the patients History and Physical, chart, labs and discussed the procedure including the risks, benefits and alternatives for the proposed anesthesia with the patient or authorized representative who has indicated his/her understanding and acceptance.     Dental advisory given  Plan Discussed with: CRNA  Anesthesia Plan Comments:          Anesthesia Quick Evaluation

## 2023-10-12 NOTE — Transfer of Care (Signed)
 Immediate Anesthesia Transfer of Care Note  Patient: Susan Baird  Procedure(s) Performed: EXCISION, CYST, BREAST (Left: Breast) IRRIGATION AND DEBRIDEMENT WOUND (Left: Breast)  Patient Location: PACU  Anesthesia Type:General  Level of Consciousness: awake, alert , and oriented  Airway & Oxygen Therapy: Patient Spontanous Breathing and Patient connected to face mask oxygen  Post-op Assessment: Report given to RN and Post -op Vital signs reviewed and stable  Post vital signs: Reviewed and stable  Last Vitals:  Vitals Value Taken Time  BP 136/63 10/12/23 13:07  Temp 36.4 C 10/12/23 13:08  Pulse 78 10/12/23 13:14  Resp 12 10/12/23 13:14  SpO2 97 % 10/12/23 13:14  Vitals shown include unfiled device data.  Last Pain:  Vitals:   10/12/23 1308  TempSrc:   PainSc: 0-No pain         Complications: No notable events documented.

## 2023-10-12 NOTE — Anesthesia Postprocedure Evaluation (Signed)
 Anesthesia Post Note  Patient: Taresa L Givan  Procedure(s) Performed: EXCISION, CYST, BREAST (Left: Breast) IRRIGATION AND DEBRIDEMENT WOUND (Left: Breast)     Patient location during evaluation: PACU Anesthesia Type: General Level of consciousness: awake and alert, oriented and patient cooperative Pain management: pain level controlled Vital Signs Assessment: post-procedure vital signs reviewed and stable Respiratory status: spontaneous breathing, nonlabored ventilation and respiratory function stable Cardiovascular status: blood pressure returned to baseline and stable Postop Assessment: no apparent nausea or vomiting Anesthetic complications: no   No notable events documented.  Last Vitals:  Vitals:   10/12/23 1330 10/12/23 1345  BP: 126/65 122/63  Pulse: 77 61  Resp: 14 12  Temp:  (!) 36.4 C  SpO2: 96% 96%    Last Pain:  Vitals:   10/12/23 1345  TempSrc:   PainSc: 0-No pain                 Almarie CHRISTELLA Marchi

## 2023-10-12 NOTE — Interval H&P Note (Signed)
 History and Physical Interval Note: No change in exam or indication for surgery All questions answered Left breast marked with her concurrence Will proceed with excision of left breast cyst at her request  10/12/2023 10:46 AM  Susan Baird  has presented today for surgery, with the diagnosis of L98.9.  The various methods of treatment have been discussed with the patient and family. After consideration of risks, benefits and other options for treatment, the patient has consented to  Procedure(s) with comments: EXCISION, CYST, BREAST (Left) - Excision of left breast cyst, debridement of tissue IRRIGATION AND DEBRIDEMENT WOUND (Left) as a surgical intervention.  The patient's history has been reviewed, patient examined, no change in status, stable for surgery.  I have reviewed the patient's chart and labs.  Questions were answered to the patient's satisfaction.     Leonce KATHEE Birmingham

## 2023-10-13 ENCOUNTER — Encounter (HOSPITAL_COMMUNITY): Payer: Self-pay | Admitting: Plastic Surgery

## 2023-10-15 LAB — SURGICAL PATHOLOGY

## 2023-10-20 ENCOUNTER — Ambulatory Visit (INDEPENDENT_AMBULATORY_CARE_PROVIDER_SITE_OTHER): Admitting: Plastic Surgery

## 2023-10-20 DIAGNOSIS — L989 Disorder of the skin and subcutaneous tissue, unspecified: Secondary | ICD-10-CM

## 2023-10-20 NOTE — Progress Notes (Signed)
 Susan Baird returns today approximately 1 week postop from excision of a cyst on the lateral portion of the left breast.  Cyst returned as an epidermal inclusion cyst.  I discussed this pathology with her  On her examination the incision is clean dry and intact.  There is no evidence of infection.  We discussed scar massage and management of the wound.  I will ask her to refrain from submerging the incision in water for minimum 4 weeks however she may begin gardening and activity as tolerated.  Follow-up in approximately a month.

## 2023-11-01 ENCOUNTER — Encounter: Admitting: Student

## 2023-11-11 DIAGNOSIS — Z1211 Encounter for screening for malignant neoplasm of colon: Secondary | ICD-10-CM | POA: Diagnosis not present

## 2023-11-11 DIAGNOSIS — Z1212 Encounter for screening for malignant neoplasm of rectum: Secondary | ICD-10-CM | POA: Diagnosis not present

## 2023-11-13 LAB — COLOGUARD: Cologuard: NEGATIVE

## 2023-11-15 ENCOUNTER — Encounter: Admitting: Student

## 2023-11-18 LAB — COLOGUARD: COLOGUARD: NEGATIVE

## 2023-12-07 ENCOUNTER — Ambulatory Visit: Payer: Self-pay | Admitting: Family Medicine

## 2023-12-07 ENCOUNTER — Encounter: Payer: Self-pay | Admitting: Family Medicine

## 2023-12-20 ENCOUNTER — Encounter: Admitting: Plastic Surgery

## 2024-01-01 DIAGNOSIS — F17210 Nicotine dependence, cigarettes, uncomplicated: Secondary | ICD-10-CM | POA: Diagnosis not present

## 2024-01-01 DIAGNOSIS — J329 Chronic sinusitis, unspecified: Secondary | ICD-10-CM | POA: Diagnosis not present

## 2024-06-16 ENCOUNTER — Encounter: Admitting: Family Medicine

## 2024-06-20 ENCOUNTER — Encounter: Payer: Self-pay | Admitting: Family Medicine
